# Patient Record
Sex: Female | Born: 1975 | Race: Black or African American | Hispanic: No | Marital: Single | Smoking: Current every day smoker
Health system: Southern US, Community
[De-identification: ages and names within clinical notes are randomized; demographics above are authoritative.]

## PROBLEM LIST (undated history)

## (undated) DIAGNOSIS — D649 Anemia, unspecified: Secondary | ICD-10-CM

## (undated) DIAGNOSIS — J45909 Unspecified asthma, uncomplicated: Secondary | ICD-10-CM

---

## 2014-03-01 ENCOUNTER — Emergency Department: Payer: Self-pay | Admitting: Emergency Medicine

## 2014-04-02 ENCOUNTER — Emergency Department: Payer: Self-pay | Admitting: Emergency Medicine

## 2014-05-10 ENCOUNTER — Emergency Department: Payer: Self-pay | Admitting: Internal Medicine

## 2014-05-15 ENCOUNTER — Emergency Department: Payer: Self-pay | Admitting: Emergency Medicine

## 2014-10-30 ENCOUNTER — Emergency Department
Admit: 2014-10-30 | Disposition: A | Discharge status: Home / Self Care | Payer: Self-pay | Admitting: Emergency Medicine

## 2014-10-30 LAB — CBC WITH DIFFERENTIAL/PLATELET
Basophil #: 0 10*3/uL (ref 0.0–0.1)
Basophil %: 0.6 %
EOS PCT: 0.3 %
Eosinophil #: 0 10*3/uL (ref 0.0–0.7)
HCT: 26.6 % — ABNORMAL LOW (ref 35.0–47.0)
HGB: 8 g/dL — ABNORMAL LOW (ref 12.0–16.0)
LYMPHS ABS: 2.5 10*3/uL (ref 1.0–3.6)
LYMPHS PCT: 35.1 %
MCH: 20.3 pg — ABNORMAL LOW (ref 26.0–34.0)
MCHC: 29.9 g/dL — ABNORMAL LOW (ref 32.0–36.0)
MCV: 68 fL — ABNORMAL LOW (ref 80–100)
MONOS PCT: 7.5 %
Monocyte #: 0.5 x10 3/mm (ref 0.2–0.9)
Neutrophil #: 4 10*3/uL (ref 1.4–6.5)
Neutrophil %: 56.5 %
PLATELETS: 481 10*3/uL — AB (ref 150–440)
RBC: 3.93 10*6/uL (ref 3.80–5.20)
RDW: 19.3 % — AB (ref 11.5–14.5)
WBC: 7.1 10*3/uL (ref 3.6–11.0)

## 2014-10-30 LAB — BASIC METABOLIC PANEL
ANION GAP: 9 (ref 7–16)
BUN: 7 mg/dL
CHLORIDE: 106 mmol/L
Calcium, Total: 8.8 mg/dL — ABNORMAL LOW
Co2: 24 mmol/L
Creatinine: 0.63 mg/dL
EGFR (African American): >60
GLUCOSE: 84 mg/dL
Potassium: 3.3 mmol/L — ABNORMAL LOW
SODIUM: 139 mmol/L

## 2014-12-08 ENCOUNTER — Emergency Department: Payer: Self-pay

## 2014-12-08 ENCOUNTER — Emergency Department
Admission: EM | Admit: 2014-12-08 | Discharge: 2014-12-08 | Disposition: A | Discharge status: Home / Self Care | Payer: Self-pay | Attending: Emergency Medicine | Admitting: Emergency Medicine

## 2014-12-08 DIAGNOSIS — N739 Female pelvic inflammatory disease, unspecified: Secondary | ICD-10-CM

## 2014-12-08 DIAGNOSIS — R112 Nausea with vomiting, unspecified: Secondary | ICD-10-CM | POA: Insufficient documentation

## 2014-12-08 DIAGNOSIS — N760 Acute vaginitis: Secondary | ICD-10-CM

## 2014-12-08 DIAGNOSIS — B9689 Other specified bacterial agents as the cause of diseases classified elsewhere: Secondary | ICD-10-CM

## 2014-12-08 DIAGNOSIS — Z3202 Encounter for pregnancy test, result negative: Secondary | ICD-10-CM | POA: Insufficient documentation

## 2014-12-08 DIAGNOSIS — R102 Pelvic and perineal pain: Secondary | ICD-10-CM

## 2014-12-08 DIAGNOSIS — R197 Diarrhea, unspecified: Secondary | ICD-10-CM | POA: Insufficient documentation

## 2014-12-08 DIAGNOSIS — Z720 Tobacco use: Secondary | ICD-10-CM | POA: Insufficient documentation

## 2014-12-08 HISTORY — DX: Anemia, unspecified: D64.9

## 2014-12-08 LAB — CBC WITH DIFFERENTIAL/PLATELET
BASOS ABS: 0.1 10*3/uL (ref 0–0.1)
Basophils Relative: 1 %
EOS ABS: 0.1 10*3/uL (ref 0–0.7)
HCT: 29.5 % — ABNORMAL LOW (ref 35.0–47.0)
Hemoglobin: 8.6 g/dL — ABNORMAL LOW (ref 12.0–16.0)
Lymphocytes Relative: 43 %
Lymphs Abs: 2.1 10*3/uL (ref 1.0–3.6)
MCH: 20 pg — AB (ref 26.0–34.0)
MCHC: 29.3 g/dL — AB (ref 32.0–36.0)
MCV: 68.4 fL — ABNORMAL LOW (ref 80.0–100.0)
MONO ABS: 0.3 10*3/uL (ref 0.2–0.9)
NEUTROS ABS: 2.3 10*3/uL (ref 1.4–6.5)
Neutrophils Relative %: 49 %
PLATELETS: 403 10*3/uL (ref 150–440)
RBC: 4.31 MIL/uL (ref 3.80–5.20)
RDW: 21 % — ABNORMAL HIGH (ref 11.5–14.5)
WBC: 4.8 10*3/uL (ref 3.6–11.0)

## 2014-12-08 LAB — CHLAMYDIA/NGC RT PCR (ARMC ONLY)
CHLAMYDIA TR: DETECTED
N gonorrhoeae: NOT DETECTED

## 2014-12-08 LAB — URINALYSIS COMPLETE WITH MICROSCOPIC (ARMC ONLY)
BACTERIA UA: NONE SEEN
Bilirubin Urine: NEGATIVE
GLUCOSE, UA: NEGATIVE mg/dL
HGB URINE DIPSTICK: NEGATIVE
Ketones, ur: NEGATIVE mg/dL
Leukocytes, UA: NEGATIVE
NITRITE: NEGATIVE
PROTEIN: 30 mg/dL — AB
SPECIFIC GRAVITY, URINE: 1.027 (ref 1.005–1.030)
pH: 5 (ref 5.0–8.0)

## 2014-12-08 LAB — COMPREHENSIVE METABOLIC PANEL
ALBUMIN: 4.2 g/dL (ref 3.5–5.0)
ALK PHOS: 35 U/L — AB (ref 38–126)
ALT: 14 U/L (ref 14–54)
ANION GAP: 7 (ref 5–15)
AST: 25 U/L (ref 15–41)
BUN: 15 mg/dL (ref 6–20)
CHLORIDE: 111 mmol/L (ref 101–111)
CO2: 22 mmol/L (ref 22–32)
Calcium: 9.4 mg/dL (ref 8.9–10.3)
Creatinine, Ser: 0.9 mg/dL (ref 0.44–1.00)
GFR calc non Af Amer: >60 mL/min (ref >60)
Glucose, Bld: 90 mg/dL (ref 65–99)
Potassium: 4.4 mmol/L (ref 3.5–5.1)
Sodium: 140 mmol/L (ref 135–145)
TOTAL PROTEIN: 7.5 g/dL (ref 6.5–8.1)
Total Bilirubin: 0.3 mg/dL (ref 0.3–1.2)

## 2014-12-08 LAB — WET PREP, GENITAL: Yeast Wet Prep HPF POC: NONE SEEN

## 2014-12-08 LAB — POCT PREGNANCY, URINE: Preg Test, Ur: NEGATIVE

## 2014-12-08 LAB — LIPASE, BLOOD: Lipase: 36 U/L (ref 22–51)

## 2014-12-08 MED ORDER — MORPHINE SULFATE 4 MG/ML IJ SOLN
4.0000 mg | Freq: Once | INTRAMUSCULAR | Status: AC
  Administered 2014-12-08: 4 mg via INTRAVENOUS

## 2014-12-08 MED ORDER — DOXYCYCLINE HYCLATE 100 MG PO TABS
100.0000 mg | ORAL_TABLET | Freq: Once | ORAL | Status: AC
  Administered 2014-12-08: 100 mg via ORAL

## 2014-12-08 MED ORDER — OXYCODONE-ACETAMINOPHEN 5-325 MG PO TABS
ORAL_TABLET | ORAL | Status: AC
  Administered 2014-12-08: 2 via ORAL
  Filled 2014-12-08: qty 2

## 2014-12-08 MED ORDER — MORPHINE SULFATE 4 MG/ML IJ SOLN
INTRAMUSCULAR | Status: AC
  Administered 2014-12-08: 4 mg via INTRAVENOUS
  Filled 2014-12-08: qty 1

## 2014-12-08 MED ORDER — DOXYCYCLINE HYCLATE 100 MG PO TABS: 100.0000 mg | ORAL_TABLET | Freq: Two times a day (BID) | ORAL | Status: DC

## 2014-12-08 MED ORDER — HYDROCODONE-ACETAMINOPHEN 5-325 MG PO TABS: 1.0000 | ORAL_TABLET | ORAL | Status: DC | PRN

## 2014-12-08 MED ORDER — SODIUM CHLORIDE 0.9 % IV BOLUS (SEPSIS)
1000.0000 mL | Freq: Once | INTRAVENOUS | Status: AC
  Administered 2014-12-08: 1000 mL via INTRAVENOUS

## 2014-12-08 MED ORDER — MORPHINE SULFATE 4 MG/ML IJ SOLN
4.0000 mg | Freq: Once | INTRAMUSCULAR | Status: AC
Start: 2014-12-08 — End: 2014-12-08
  Administered 2014-12-08: 4 mg via INTRAVENOUS

## 2014-12-08 MED ORDER — MORPHINE SULFATE 4 MG/ML IJ SOLN
INTRAMUSCULAR | Status: AC
  Filled 2014-12-08: qty 1

## 2014-12-08 MED ORDER — METRONIDAZOLE 500 MG PO TABS: 500.0000 mg | ORAL_TABLET | Freq: Three times a day (TID) | ORAL | Status: AC

## 2014-12-08 MED ORDER — DOXYCYCLINE HYCLATE 100 MG PO TABS
ORAL_TABLET | ORAL | Status: AC
  Administered 2014-12-08: 100 mg via ORAL
  Filled 2014-12-08: qty 1

## 2014-12-08 MED ORDER — CEFTRIAXONE SODIUM IN DEXTROSE 20 MG/ML IV SOLN
INTRAVENOUS | Status: AC
  Administered 2014-12-08: 1 g via INTRAVENOUS
  Filled 2014-12-08: qty 50

## 2014-12-08 MED ORDER — OXYCODONE-ACETAMINOPHEN 5-325 MG PO TABS
2.0000 | ORAL_TABLET | Freq: Once | ORAL | Status: AC
  Administered 2014-12-08: 2 via ORAL

## 2014-12-08 MED ORDER — CEFTRIAXONE SODIUM IN DEXTROSE 20 MG/ML IV SOLN
1.0000 g | Freq: Once | INTRAVENOUS | Status: AC
  Administered 2014-12-08: 1 g via INTRAVENOUS

## 2014-12-08 NOTE — ED Notes (Signed)
Pt to us via stretcher

## 2014-12-08 NOTE — ED Notes (Addendum)
Pt arrives c/o nausea, vomiting diarrhea ,lower abdominal pain that began yesterday at 3AM. Lower abdominal pain. Pt received 4mg  zofran IV with EMS.  Denies blood with emesis or defecation. Pt alert and oriented X4, active, cooperative, pt in NAD. RR even and unlabored, color WNL.

## 2014-12-08 NOTE — ED Notes (Signed)
Advised pt need for urine.  Pt states she does not have to void at this time. Pt on monitor, awaiting md to see.

## 2014-12-08 NOTE — ED Provider Notes (Signed)
[facility] Emergency Department Provider Note  Time seen: 8:34 AM  I have reviewed the triage vital signs and the nursing notes.   HISTORY  Chief Complaint Nausea; Emesis; Diarrhea; and Abdominal Pain    HPI Lori Pierce is a 39 y.o. female with a past medical history of anemia presents the emergency department with 2 days of lower abdominal pain, nausea, vomiting, diarrhea. According to the patient for the past 2 days she has had a constant, gradually worsening dull/aching lower abdominal pain. She now describes the pain as severe. She states she has had this pain before with a viral gastroenteritis per the patient. She was given Zofran by EMS and states her nausea has resolved, but her abdominal pain continues. She denies any bloody/black stool or vomit. Denies any dysuria, but does note a foul smell to her urine. Denies any vaginal bleeding or discharge. Patient's last period was one month ago, she remains sexually active.    Past Medical History  Diagnosis Date  . Anemia     There are no active problems to display for this patient.   History reviewed. No pertinent past surgical history.  No current outpatient prescriptions on file.  Allergies Review of patient's allergies indicates no known allergies.  No family history on file.  Social History History  Substance Use Topics  . Smoking status: Current Every Day Smoker  . Smokeless tobacco: Not on file  . Alcohol Use: Yes    Review of Systems Constitutional: Negative for fever. Cardiovascular: Negative for chest pain. Respiratory: Negative for shortness of breath. Gastrointestinal: Positive for lower abdominal pain, nausea, vomiting, and diarrhea. Genitourinary: Negative for dysuria. Positive for foul smell to her urine. Musculoskeletal: Negative for back pain. Skin: Negative for rash. Neurological: Negative for headache 10-point ROS otherwise  negative.  ____________________________________________   PHYSICAL EXAM:  VITAL SIGNS: ED Triage Vitals  Enc Vitals Group     BP 12/08/14 0800 99/72 mmHg     Pulse Rate 12/08/14 0800 74     Resp 12/08/14 0800 18     Temp 12/08/14 0800 98.2 F (36.8 C)     Temp Source 12/08/14 0800 Oral     SpO2 12/08/14 0800 100 %     Weight 12/08/14 0800 135 lb (61.236 kg)     Height 12/08/14 0800  (1.626 m)     Head Cir --      Peak Flow --      Pain Score 12/08/14 0800 10     Pain Loc --      Pain Edu? --      Excl. in GC? --     Constitutional: Alert and oriented. Well appearing and in no distress. ENT   Head: Normocephalic and atraumatic.   Mouth/Throat: Mucous membranes are moist. Cardiovascular: Normal rate, regular rhythm. No murmur Respiratory: Normal respiratory effort without tachypnea nor retractions. Breath sounds are clear Gastrointestinal: Moderate lower abdominal tenderness to palpation. Appears mostly suprapubic/midline. No rebound or guarding. No CVA tenderness palpation. Musculoskeletal: Nontender with normal range of motion in all extremities.  Neurologic:  Normal speech and language. No gross focal neurologic deficits Skin:  Skin is warm, dry and intact.  Psychiatric: Mood and affect are normal. Speech and behavior are normal.   ____________________________________________    RADIOLOGY  2.4 cm left ovarian cyst/complicated cyst/hemorrhagic cyst  ____________________________________________   INITIAL IMPRESSION / ASSESSMENT AND PLAN / ED COURSE  Pertinent labs & imaging results that were available during my care  of the patient were reviewed by me and considered in my medical decision making (see chart for details).  Patient with 2 days of lower abdominal pain, nausea, vomiting, diarrhea. We will check labs, treat pain, nausea, IV hydrate and closely monitor in the emergency department.   Pelvic exam shows moderate discharge, with mild cervical  motion tenderness and moderate right adnexal tenderness. Wet prep positive for clue cells. We will treat the patient as pelvic inflammatory disease, patient received IV ceftriaxone and we will place on oral doxycycline. We will also place the patient on Flagyl for bacterial vaginitis. I discussed these results with the patient, as well as having her partner treated/tested. Patient agreeable to plan. We will discharge the patient on Norco for pain management, and antibiotics. Patient to follow-up with OB/GYN for a repeat ultrasound in 2-4 weeks to ensure resolution of complicated left ovarian cyst. Patient agreeable to this plan. ____________________________________________   FINAL CLINICAL IMPRESSION(S) / ED DIAGNOSES  Lower abdominal pain Pelvic inflammatory disease Bacterial vaginitis   Minna Antis, MD 12/08/14 1318

## 2014-12-08 NOTE — ED Notes (Signed)
Pt given warm blanket.  Resting on stretcher at this time.

## 2014-12-08 NOTE — Discharge Instructions (Signed)
Pelvic Inflammatory Disease °Pelvic inflammatory disease (PID) refers to an infection in some or all of the female organs. The infection can be in the uterus, ovaries, fallopian tubes, or the surrounding tissues in the pelvis. PID can cause abdominal or pelvic pain that comes on suddenly (acute pelvic pain). PID is a serious infection because it can lead to lasting (chronic) pelvic pain or the inability to have children (infertile).  °CAUSES  °The infection is often caused by the normal bacteria found in the vaginal tissues. PID may also be caused by an infection that is spread during sexual contact. PID can also occur following:  °· The birth of a baby.   °· A miscarriage.   °· An abortion.   °· Major pelvic surgery.   °· The use of an intrauterine device (IUD).   °· A sexual assault.   °RISK FACTORS °Certain factors can put a person at higher risk for PID, such as: °· Being younger than 25 years. °· Being sexually active at a young age. °· Using nonbarrier contraception. °· Having multiple sexual partners. °· Having sex with someone who has symptoms of a genital infection. °· Using oral contraception. °Other times, certain behaviors can increase the possibility of getting PID, such as: °· Having sex during your period. °· Using a vaginal douche. °· Having an intrauterine device (IUD) in place. °SYMPTOMS  °· Abdominal or pelvic pain.   °· Fever.   °· Chills.   °· Abnormal vaginal discharge. °· Abnormal uterine bleeding.   °· Unusual pain shortly after finishing your period. °DIAGNOSIS  °Your caregiver will choose some of the following methods to make a diagnosis, such as:  °· Performing a physical exam and history. A pelvic exam typically reveals a very tender uterus and surrounding pelvis.   °· Ordering laboratory tests including a pregnancy test, blood tests, and urine test.  °· Ordering cultures of the vagina and cervix to check for a sexually transmitted infection (STI). °· Performing an ultrasound.    °· Performing a laparoscopic procedure to look inside the pelvis.   °TREATMENT  °· Antibiotic medicines may be prescribed and taken by mouth.   °· Sexual partners may be treated when the infection is caused by a sexually transmitted disease (STD).   °· Hospitalization may be needed to give antibiotics intravenously. °· Surgery may be needed, but this is rare. °It may take weeks until you are completely well. If you are diagnosed with PID, you should also be checked for human immunodeficiency virus (HIV).   °HOME CARE INSTRUCTIONS  °· If given, take your antibiotics as directed. Finish the medicine even if you start to feel better.   °· Only take over-the-counter or prescription medicines for pain, discomfort, or fever as directed by your caregiver.   °· Do not have sexual intercourse until treatment is completed or as directed by your caregiver. If PID is confirmed, your recent sexual partner(s) will need treatment.   °· Keep your follow-up appointments. °SEEK MEDICAL CARE IF:  °· You have increased or abnormal vaginal discharge.   °· You need prescription medicine for your pain.   °· You vomit.   °· You cannot take your medicines.   °· Your partner has an STD.   °SEEK IMMEDIATE MEDICAL CARE IF:  °· You have a fever.   °· You have increased abdominal or pelvic pain.   °· You have chills.   °· You have pain when you urinate.   °· You are not better after 72 hours following treatment.   °MAKE SURE YOU:  °· Understand these instructions. °· Will watch your condition. °· Will get help right away if you are not doing well or get worse. °  Document Released: 06/19/2005 Document Revised: 10/14/2012 Document Reviewed: 06/15/2011 [facility] Patient Information 2015 [place], [place]. This information is not intended to replace advice given to you by your health care provider. Make sure you discuss any questions you have with your health care provider.     As we have discussed please take all of your medications as  prescribed. Please take the full course of antibiotics even if feeling better. As we have discussed her ultrasound shows a 2.4 cm left ovarian cyst, please follow-up with OB/GYN within 2-4 weeks for follow-up, to ensure resolution. Return to the emergency department for any fever, worsening symptoms, worsening abdominal pain, or any other personally concerning symptoms.

## 2015-01-22 ENCOUNTER — Emergency Department
Admission: EM | Admit: 2015-01-22 | Discharge: 2015-01-22 | Disposition: A | Discharge status: Home / Self Care | Payer: Self-pay | Attending: Emergency Medicine | Admitting: Emergency Medicine

## 2015-01-22 DIAGNOSIS — Z720 Tobacco use: Secondary | ICD-10-CM | POA: Insufficient documentation

## 2015-01-22 DIAGNOSIS — H1031 Unspecified acute conjunctivitis, right eye: Secondary | ICD-10-CM | POA: Insufficient documentation

## 2015-01-22 DIAGNOSIS — Z792 Long term (current) use of antibiotics: Secondary | ICD-10-CM | POA: Insufficient documentation

## 2015-01-22 MED ORDER — EYE WASH OPHTH SOLN
OPHTHALMIC | Status: AC
  Filled 2015-01-22: qty 118

## 2015-01-22 MED ORDER — TOBRAMYCIN 0.3 % OP SOLN: 2.0000 [drp] | OPHTHALMIC | Status: DC

## 2015-01-22 MED ORDER — FLUORESCEIN SODIUM 1 MG OP STRP
1.0000 | ORAL_STRIP | Freq: Once | OPHTHALMIC | Status: DC
  Filled 2015-01-22: qty 1

## 2015-01-22 MED ORDER — TETRACAINE HCL 0.5 % OP SOLN
1.0000 [drp] | Freq: Once | OPHTHALMIC | Status: AC
  Administered 2015-01-22: 1 [drp] via OPHTHALMIC
  Filled 2015-01-22: qty 2

## 2015-01-22 MED ORDER — EYE WASH OPHTH SOLN
1.0000 [drp] | OPHTHALMIC | Status: DC | PRN
  Administered 2015-01-22: 1 [drp] via OPHTHALMIC
  Filled 2015-01-22: qty 118

## 2015-01-22 MED ORDER — TETRACAINE HCL 0.5 % OP SOLN
OPHTHALMIC | Status: AC
  Filled 2015-01-22: qty 2

## 2015-01-22 NOTE — ED Notes (Signed)
Pt c/o right eye irritation with drainage since yesterday

## 2015-01-22 NOTE — ED Provider Notes (Signed)
[facility] Emergency Department Provider Note  ____________________________________________  Time seen: Approximately 7:29 AM  I have reviewed the triage vital signs and the nursing notes.   HISTORY  Chief Complaint Eye Drainage   HPI Lori Pierce is a 39 y.o. female is here with complaint of right eye irritation and drainage since yesterday. She states she has been exposed to pinkeye. Drainage has been yellow/green at times. She states her eyelashes were matted shut first thing this morning. She denies any changes in her vision. She continues to wear contacts in her affected eye. She denies any foreign body or injury to her eye. Currently her pain is 6 out of 10.   Past Medical History  Diagnosis Date  . Anemia     There are no active problems to display for this patient.   History reviewed. No pertinent past surgical history.  Current Outpatient Rx  Name  Route  Sig  Dispense  Refill  . doxycycline (VIBRA-TABS) 100 MG tablet   Oral   Take 1 tablet (100 mg total) by mouth 2 (two) times daily.   20 tablet   0   . HYDROcodone-acetaminophen (NORCO) 5-325 MG per tablet   Oral   Take 1 tablet by mouth every 4 (four) hours as needed for moderate pain.   12 tablet   0   . tobramycin (TOBREX) 0.3 % ophthalmic solution   Left Eye   Place 2 drops into the left eye every 4 (four) hours.   5 mL   0     Allergies Review of patient's allergies indicates no known allergies.  No family history on file.  Social History History  Substance Use Topics  . Smoking status: Current Every Day Smoker  . Smokeless tobacco: Not on file  . Alcohol Use: Yes    Review of Systems Constitutional: No fever/chills Eyes: No visual changes. ENT: No sore throat. Cardiovascular: Denies chest pain. Respiratory: Denies shortness of breath. Gastrointestinal: No abdominal pain.  No nausea, no vomiting. Genitourinary: Negative for dysuria. Musculoskeletal:  Negative for back pain. Skin: Negative for rash. Neurological: Negative for headaches  10-point ROS otherwise negative.  ____________________________________________   PHYSICAL EXAM:  VITAL SIGNS: ED Triage Vitals  Enc Vitals Group     BP 01/22/15 0717 141/91 mmHg     Pulse Rate 01/22/15 0717 92     Resp 01/22/15 0717 16     Temp 01/22/15 0717 98.3 F (36.8 C)     Temp Source 01/22/15 0717 Oral     SpO2 01/22/15 0717 100 %     Weight 01/22/15 0717 130 lb (58.968 kg)     Height 01/22/15 0717 5\' 4"  (1.626 m)     Head Cir --      Peak Flow --      Pain Score 01/22/15 0718 6     Pain Loc --      Pain Edu? --      Excl. in GC? --     Constitutional: Alert and oriented. Well appearing and in no acute distress. Eyes: Conjunctivae are normal. PERRL. EOMI.   right sclera with mild injection and minimal discharge. Head: Atraumatic. Nose: No congestion/rhinnorhea.  Mouth/Throat: Mucous membranes are moist.  Oropharynx non-erythematous. Neck: No stridor.  Supple Cardiovascular: Normal rate, regular rhythm. Grossly normal heart sounds.  Good peripheral circulation. Respiratory: Normal respiratory effort.  No retractions. Lungs CTAB. Gastrointestinal: Soft and nontender. No distention. Musculoskeletal: No lower extremity tenderness nor edema.  No joint  effusions. Neurologic:  Normal speech and language. No gross focal neurologic deficits are appreciated. No gait instability. Skin:  Skin is warm, dry and intact. No rash noted. Psychiatric: Mood and affect are normal. Speech and behavior are normal.  ____________________________________________   LABS (all labs ordered are listed, but only abnormal results are displayed)  Labs Reviewed - No data to display  PROCEDURES  Procedure(s) performed: 2 drops of tetracaine was placed in the right eye. Right upper lid was inverted without foreign body seen. 4 seen stain was placed without any corneal abrasions seen. I was flushed with  ophthalmic eyewash.  Critical Care performed: No  ____________________________________________   INITIAL IMPRESSION / ASSESSMENT AND PLAN / ED COURSE  Pertinent labs & imaging results that were available during my care of the patient were reviewed by me and considered in my medical decision making (see chart for details).  Patient was prescribed tobramycin ophthalmic solution 2 drops to eye every 4 hours. She is also to remove her contacts and began using her glasses. She will not use her contacts until completely sterilized or if instructed by her doctor to throw this pair of contacts away.  If not improved she is to follow-up with [facility]. ____________________________________________   FINAL CLINICAL IMPRESSION(S) / ED DIAGNOSES  Final diagnoses:  Acute conjunctivitis of right eye      Tommi Rumps, PA-C 01/22/15 1028  Phineas Semen, MD 01/22/15 1054

## 2015-01-22 NOTE — Discharge Instructions (Signed)

## 2015-02-03 ENCOUNTER — Emergency Department
Admission: EM | Admit: 2015-02-03 | Discharge: 2015-02-03 | Disposition: A | Discharge status: Home / Self Care | Payer: Self-pay | Attending: Emergency Medicine | Admitting: Emergency Medicine

## 2015-02-03 ENCOUNTER — Encounter: Payer: Self-pay | Admitting: Medical Oncology

## 2015-02-03 DIAGNOSIS — H109 Unspecified conjunctivitis: Secondary | ICD-10-CM | POA: Insufficient documentation

## 2015-02-03 DIAGNOSIS — Z720 Tobacco use: Secondary | ICD-10-CM | POA: Insufficient documentation

## 2015-02-03 MED ORDER — EYE WASH OPHTH SOLN
1.0000 [drp] | OPHTHALMIC | Status: DC | PRN
  Administered 2015-02-03: 1 [drp] via OPHTHALMIC
  Filled 2015-02-03: qty 118

## 2015-02-03 MED ORDER — TOBRAMYCIN 0.3 % OP SOLN: 2.0000 [drp] | OPHTHALMIC | Status: DC

## 2015-02-03 MED ORDER — TETRACAINE HCL 0.5 % OP SOLN
1.0000 [drp] | Freq: Once | OPHTHALMIC | Status: AC
  Administered 2015-02-03: 1 [drp] via OPHTHALMIC
  Filled 2015-02-03: qty 2

## 2015-02-03 MED ORDER — FLUORESCEIN SODIUM 1 MG OP STRP
1.0000 | ORAL_STRIP | Freq: Once | OPHTHALMIC | Status: AC
  Administered 2015-02-03: 1 via OPHTHALMIC
  Filled 2015-02-03: qty 1

## 2015-02-03 NOTE — ED Notes (Signed)
Rt eye irritation, redness and draining since yesterday.

## 2015-02-03 NOTE — Discharge Instructions (Signed)
Conjunctivitis Conjunctivitis is commonly called "pink eye." Conjunctivitis can be caused by bacterial or viral infection, allergies, or injuries. There is usually redness of the lining of the eye, itching, discomfort, and sometimes discharge. There may be deposits of matter along the eyelids. A viral infection usually causes a watery discharge, while a bacterial infection causes a yellowish, thick discharge. Pink eye is very contagious and spreads by direct contact. You may be given antibiotic eyedrops as part of your treatment. Before using your eye medicine, remove all drainage from the eye by washing gently with warm water and cotton balls. Continue to use the medication until you have awakened 2 mornings in a row without discharge from the eye. Do not rub your eye. This increases the irritation and helps spread infection. Use separate towels from other household members. Wash your hands with soap and water before and after touching your eyes. Use cold compresses to reduce pain and sunglasses to relieve irritation from light. Do not wear contact lenses or wear eye makeup until the infection is gone. SEEK MEDICAL CARE IF:   Your symptoms are not better after 3 days of treatment.  You have increased pain or trouble seeing.  The outer eyelids become very red or swollen. Document Released: 07/27/2004 Document Revised: 09/11/2011 Document Reviewed: 06/19/2005 [facility] [place] [facility] Patient Information 2015 [place], [place]. This information is not intended to replace advice given to you by your health care provider. Make sure you discuss any questions you have with your health care provider.   YOU WILL NEED TO FOLLOW UP WITH DR. PORFILIO.  CALL FOR AN APPOINTMENT.  DO NOT WEAR YOUR CONTACTS UNTIL YOUR EYE IS COMPLETELY CLEAR USE EYE DROPS AS DIRECTED EVERY 4 HOURS WHILE AWAKE DO WASH YOUR HANDS IMMEDIATELY AFTER TOUCHING YOUR EYE YOU CAN NOT WEAR THE CONTACT THAT YOU WERE WEARING WHEN YOUR EYE GOT INFECTED

## 2015-02-03 NOTE — ED Provider Notes (Signed)
[facility] Emergency Department Provider Note  ____________________________________________  Time seen:  7:13 AM  I have reviewed the triage vital signs and the nursing notes.   HISTORY  Chief Complaint Conjunctivitis   HPI Lori Pierce is a 39 y.o. female is here with right eye irritation and redness. She states it was draining yesterday. This morning her eyelashes were matted shut and draining thick yellowish stuff. She denies any difficulty with her vision. 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 She was seen on 7/22 at which time she was having redness and drainage from the same eye. At that time she gave a history of being exposed to pink eye. She continues to wear disposable contacts that she changes once a week. She denies any foreign body sensation or injury to her eye. Currently she describes her pain as a 10 out of 10. She has not taken any over-the-counter medication for this.   Past Medical History  Diagnosis Date  . Anemia     There are no active problems to display for this patient.   History reviewed. No pertinent past surgical history.  Current Outpatient Rx  Name  Route  Sig  Dispense  Refill  . doxycycline (VIBRA-TABS) 100 MG tablet   Oral   Take 1 tablet (100 mg total) by mouth 2 (two) times daily.   20 tablet   0   . HYDROcodone-acetaminophen (NORCO) 5-325 MG per tablet   Oral   Take 1 tablet by mouth every 4 (four) hours as needed for moderate pain.   12 tablet   0   . tobramycin (TOBREX) 0.3 % ophthalmic solution   Right Eye   Place 2 drops into the right eye every 4 (four) hours.   5 mL   0     Allergies Review of patient's allergies indicates no known allergies.  No family history on file.  Social History History  Substance Use Topics  . Smoking status: Current Every Day Smoker  . Smokeless tobacco: Not on file   . Alcohol Use: Yes    Review of Systems Constitutional: No fever/chills Eyes: No visual changes, drainage and matting positive. ENT: No sore throat. Cardiovascular: Denies chest pain. Respiratory: Denies shortness of breath. Gastrointestinal: No abdominal pain.  No nausea, no vomiting. Genitourinary: Negative for dysuria. Musculoskeletal: Negative for back pain. Skin: Negative for rash. Neurological: Negative for headaches, focal weakness or numbness.  10-point ROS otherwise negative.  ____________________________________________   PHYSICAL EXAM:  VITAL SIGNS: ED Triage Vitals  Enc Vitals Group     BP 02/03/15 0706 121/90 mmHg     Pulse Rate 02/03/15 0706 91     Resp 02/03/15 0706 18     Temp 02/03/15 0706 98.7 F (37.1 C)     Temp Source 02/03/15 0706 Oral     SpO2 02/03/15 0706 99 %     Weight 02/03/15 0706 130 lb (58.968 kg)     Height 02/03/15 0706  (1.626 m)     Head Cir --      Peak Flow --      Pain Score 02/03/15 0706 10     Pain Loc --      Pain Edu? --      Excl. in GC? --     Constitutional: Alert and oriented. Well appearing and in no acute distress. Eyes: Right eye with moderate injection. There is evidence of dried  exudate along the lower eyelid and lashes. PERRL. EOMI. no foreign body was noted. Head: Atraumatic. Nose: No congestion/rhinnorhea. Neck: No stridor. Cardiovascular: Normal rate, regular rhythm. Grossly normal heart sounds.  Good peripheral circulation. Respiratory: Normal respiratory effort.  No retractions. Lungs CTAB. Gastrointestinal: Soft and nontender. No distention. Musculoskeletal: No lower extremity tenderness nor edema.  No joint effusions. Neurologic:  Normal speech and language. No gross focal neurologic deficits are appreciated. No gait instability. Skin:  Skin is warm, dry and intact. No rash noted. Psychiatric: Mood and affect are normal. Speech and behavior are  normal.  ____________________________________________   LABS (all labs ordered are listed, but only abnormal results are displayed)  Labs Reviewed - No data to display  PROCEDURES  Procedure(s) performed: Stain was placed in the right eye without any corneal abrasions noted. Eye was flushed with eyewash. Upper lid was inverted without foreign body seen. Lower lid no foreign body noted. No corneal ulcer was noted. There is no fluroscene uptake.  Critical Care performed: No  ____________________________________________   INITIAL IMPRESSION / ASSESSMENT AND PLAN / ED COURSE  Pertinent labs & imaging results that were available during my care of the patient were reviewed by me and considered in my medical decision making (see chart for details).  Patient was told to follow-up with [facility] since this is the second time in less than 30 days that she has been here for the same thing. She became quite upset when there was not a specific date on her note saying when she would be going back to work. I explained that she is out of work until her eye completely clears. At that time she became extremely irritated. She states that her company will not accept this note. She also offered to get the note from her last visit out of her car to show me.  Apparently this was not given to her supervisor. I explained that her vision and eye care is very concerning especially since she wears contacts. Patient left disgruntled ____________________________________________   FINAL CLINICAL IMPRESSION(S) / ED DIAGNOSES  Final diagnoses:  Conjunctivitis of right eye      Tommi Rumps, PA-C 02/03/15 1436  Arnaldo Natal, MD 02/03/15 [phone]

## 2015-02-03 NOTE — ED Notes (Signed)
NAD noted at time of D/C. Pt ambulatory to the lobby at this time.

## 2015-02-03 NOTE — ED Notes (Signed)
Pt requesting work note with a date. Per PA, unable to give pt an exact date d/t not being able to predict when conjunctivitis will be completely clear. Pt upset at this time d/t PA not giving an exact return to work date. Pt took work note and states her supervisor will be calling her.

## 2016-01-26 ENCOUNTER — Emergency Department: Payer: Self-pay

## 2016-01-26 ENCOUNTER — Encounter: Payer: Self-pay | Admitting: Emergency Medicine

## 2016-01-26 DIAGNOSIS — F17200 Nicotine dependence, unspecified, uncomplicated: Secondary | ICD-10-CM | POA: Insufficient documentation

## 2016-01-26 DIAGNOSIS — J45909 Unspecified asthma, uncomplicated: Secondary | ICD-10-CM | POA: Insufficient documentation

## 2016-01-26 DIAGNOSIS — R0789 Other chest pain: Secondary | ICD-10-CM | POA: Insufficient documentation

## 2016-01-26 DIAGNOSIS — R0600 Dyspnea, unspecified: Secondary | ICD-10-CM | POA: Insufficient documentation

## 2016-01-26 NOTE — ED Triage Notes (Signed)
Pt ambulatory to triage with steady gait, with c/o shortness of breath due to LEFT sided rib pain since yesterday. States pain began yesterday in her back and now has radiating to left side, pt reports pain is under left breast. Pt reports when taking in a deep breath, pain shoots through left side. Pt denies known injury to area. Pt alert and oriented x 4.

## 2016-01-27 ENCOUNTER — Emergency Department
Admission: EM | Admit: 2016-01-27 | Discharge: 2016-01-27 | Discharge status: Against Medical Advice | Payer: Self-pay | Attending: Emergency Medicine | Admitting: Emergency Medicine

## 2016-01-27 DIAGNOSIS — R079 Chest pain, unspecified: Secondary | ICD-10-CM

## 2016-01-27 DIAGNOSIS — R0600 Dyspnea, unspecified: Secondary | ICD-10-CM

## 2016-01-27 HISTORY — DX: Unspecified asthma, uncomplicated: J45.909

## 2016-01-27 LAB — BASIC METABOLIC PANEL
Anion gap: 8 (ref 5–15)
BUN: 10 mg/dL (ref 6–20)
CHLORIDE: 105 mmol/L (ref 101–111)
CO2: 25 mmol/L (ref 22–32)
CREATININE: 0.61 mg/dL (ref 0.44–1.00)
Calcium: 8.7 mg/dL — ABNORMAL LOW (ref 8.9–10.3)
GFR calc non Af Amer: >60 mL/min (ref >60)
Glucose, Bld: 79 mg/dL (ref 65–99)
POTASSIUM: 3.1 mmol/L — AB (ref 3.5–5.1)
SODIUM: 138 mmol/L (ref 135–145)

## 2016-01-27 LAB — CBC
HEMATOCRIT: 34.2 % — AB (ref 35.0–47.0)
HEMOGLOBIN: 11.1 g/dL — AB (ref 12.0–16.0)
MCH: 25.5 pg — ABNORMAL LOW (ref 26.0–34.0)
MCHC: 32.6 g/dL (ref 32.0–36.0)
MCV: 78.4 fL — ABNORMAL LOW (ref 80.0–100.0)
PLATELETS: 377 10*3/uL (ref 150–440)
RBC: 4.36 MIL/uL (ref 3.80–5.20)
RDW: 21.3 % — ABNORMAL HIGH (ref 11.5–14.5)
WBC: 7.7 10*3/uL (ref 3.6–11.0)

## 2016-01-27 LAB — TROPONIN I: Troponin I: <0.03 ng/mL (ref <0.03)

## 2016-01-27 LAB — FIBRIN DERIVATIVES D-DIMER (ARMC ONLY): Fibrin derivatives D-dimer (ARMC): 403 (ref 0–499)

## 2016-01-27 MED ORDER — POTASSIUM CHLORIDE CRYS ER 20 MEQ PO TBCR
40.0000 meq | EXTENDED_RELEASE_TABLET | Freq: Once | ORAL | Status: AC
  Administered 2016-01-27: 40 meq via ORAL
  Filled 2016-01-27: qty 2

## 2016-01-27 NOTE — ED Notes (Signed)
Pt refusing to stay.  Pt states "my ride is here and I can't get left, so I'm leaving".  Pt signed AMA form.  Pt in NAD upon leaving hospital.  Pt ambulatory to lobby.

## 2016-01-29 NOTE — ED Provider Notes (Signed)
[facility] Emergency Department Provider Note  ____________________________________________  Time seen: 1:30 AM  I have reviewed the triage vital signs and the nursing notes.   HISTORY  Chief Complaint Shortness of Breath     HPI Lori Pierce is a 40 y.o. female presents with dyspnea and right-sided left chest discomfort times one day. Patient states that the pain initially started in her back and has since radiated to the anterior left side of the chest. Patient states the pain is worse with deep inspiration. Patient denies any cough no fever. Patient does admits to tobacco use and a history of asthma.    Past Medical History:  Diagnosis Date  . Anemia   . Asthma     There are no active problems to display for this patient.   Past surgical history None  Prior to Admission medications   Medication Sig Start Date End Date Taking? Authorizing Provider  doxycycline (VIBRA-TABS) 100 MG tablet Take 1 tablet (100 mg total) by mouth 2 (two) times daily. 12/08/14   Minna Antis, MD  HYDROcodone-acetaminophen (NORCO) 5-325 MG per tablet Take 1 tablet by mouth every 4 (four) hours as needed for moderate pain. 12/08/14   Minna Antis, MD  tobramycin (TOBREX) 0.3 % ophthalmic solution Place 2 drops into the right eye every 4 (four) hours. 02/03/15   Tommi Rumps, PA-C    Allergies No known drug allergies No family history on file.  Social History Social History  Substance Use Topics  . Smoking status: Current Every Day Smoker  . Smokeless tobacco: Not on file  . Alcohol use Yes    Review of Systems  Constitutional: Negative for fever. Eyes: Negative for visual changes. ENT: Negative for sore throat. Cardiovascular: Positive for chest pain. Respiratory: Positive for shortness of breath. Gastrointestinal: Negative for abdominal pain, vomiting and diarrhea. Genitourinary: Negative for dysuria. Musculoskeletal: Negative for back  pain. Skin: Negative for rash. Neurological: Negative for headaches, focal weakness or numbness.   10-point ROS otherwise negative.  ____________________________________________   PHYSICAL EXAM:  VITAL SIGNS: ED Triage Vitals  Enc Vitals Group     BP 01/26/16 2323 131/83     Pulse Rate 01/26/16 2323 76     Resp 01/26/16 2323 18     Temp 01/26/16 2323 98.3 F (36.8 C)     Temp src --      SpO2 01/26/16 2323 99 %     Weight 01/26/16 2323 120 lb (54.4 kg)     Height 01/26/16 2323  (1.626 m)     Head Circumference --      Peak Flow --      Pain Score 01/26/16 2324 10     Pain Loc --      Pain Edu? --      Excl. in GC? --     Constitutional: Alert and oriented. Well appearing and in no distress. Eyes: Conjunctivae are normal. PERRL. Normal extraocular movements. ENT   Head: Normocephalic and atraumatic.   Nose: No congestion/rhinnorhea.   Mouth/Throat: Mucous membranes are moist.   Neck: No stridor. Hematological/Lymphatic/Immunilogical: No cervical lymphadenopathy. Cardiovascular: Normal rate, regular rhythm. Normal and symmetric distal pulses are present in all extremities. No murmurs, rubs, or gallops. Respiratory: Normal respiratory effort without tachypnea nor retractions. Breath sounds are clear and equal bilaterally. No wheezes/rales/rhonchi. Gastrointestinal: Soft and nontender. No distention. There is no CVA tenderness. Genitourinary: deferred Musculoskeletal: Nontender with normal range of motion in all extremities. No joint effusions.  No lower extremity tenderness nor edema. Neurologic:  Normal speech and language. No gross focal neurologic deficits are appreciated. Speech is normal.  Skin:  Skin is warm, dry and intact. No rash noted. Psychiatric: Mood and affect are normal. Speech and behavior are normal. Patient exhibits appropriate insight and judgment.  ____________________________________________    LABS (pertinent  positives/negatives)  Labs Reviewed  BASIC METABOLIC PANEL - Abnormal; Notable for the following:       Result Value   Potassium 3.1 (*)    Calcium 8.7 (*)    All other components within normal limits  CBC - Abnormal; Notable for the following:    Hemoglobin 11.1 (*)    HCT 34.2 (*)    MCV 78.4 (*)    MCH 25.5 (*)    RDW 21.3 (*)    All other components within normal limits  TROPONIN I  FIBRIN DERIVATIVES D-DIMER ([facility] ONLY)     ____________________________________________   EKG  ED ECG REPORT I, Manson N Raynaldo Falco, the attending physician, personally viewed and interpreted this ECG.   Date: 01/29/2016  EKG Time: 11:28 PM  Rate: 71  Rhythm: Normal sinus rhythm Axis: Normal  Intervals: Normal  ST&T Change: None   ____________________________________________    RADIOLOGY  CLINICAL DATA:  40 year old female with shortness of breath EXAM: CHEST  2 VIEW COMPARISON:  None. FINDINGS: The heart size and mediastinal contours are within normal limits. Both lungs are clear. The visualized skeletal structures are unremarkable. IMPRESSION: No active cardiopulmonary disease. Electronically Signed   By: Elgie Collard M.D.   On: 01/26/2016 23:48  ____________________________________________   Procedures      INITIAL IMPRESSION / ASSESSMENT AND PLAN / ED COURSE  Pertinent labs & imaging results that were available during my care of the patient were reviewed by me and considered in my medical decision making (see chart for details).  After evaluating the patient and informed her with the plan would be to discover the etiology for chest pain including d-dimer and repeat troponin patient informed the nursing staff that she had to leave and eloped from the emergency department before [facility] paperwork could be documented ____________________________________________   FINAL CLINICAL IMPRESSION(S) / ED DIAGNOSES  Chest pain Shortness of breath    Darci Current, MD 01/29/16 [phone]

## 2016-12-10 IMAGING — US US PELVIS COMPLETE
1 series · 14 of 25 positions shown · non-contrast
Comparison: None

CLINICAL DATA: Nausea, vomiting, abdominal pain for 2 days,
diarrhea ; uncertain LMP

EXAM:
TRANSABDOMINAL AND TRANSVAGINAL ULTRASOUND OF PELVIS
TECHNIQUE: Both transabdominal and transvaginal ultrasound examinations of the
pelvis were performed. Transabdominal technique was performed for
global imaging of the pelvis including uterus, ovaries, adnexal
regions, and pelvic cul-de-sac. It was necessary to proceed with
endovaginal exam following the transabdominal exam to visualize the
ovaries and endometrium.

[Series 1: us pelvis complete · 0.21mm/px · 14 of 99 slices shown]
[im 1/99]
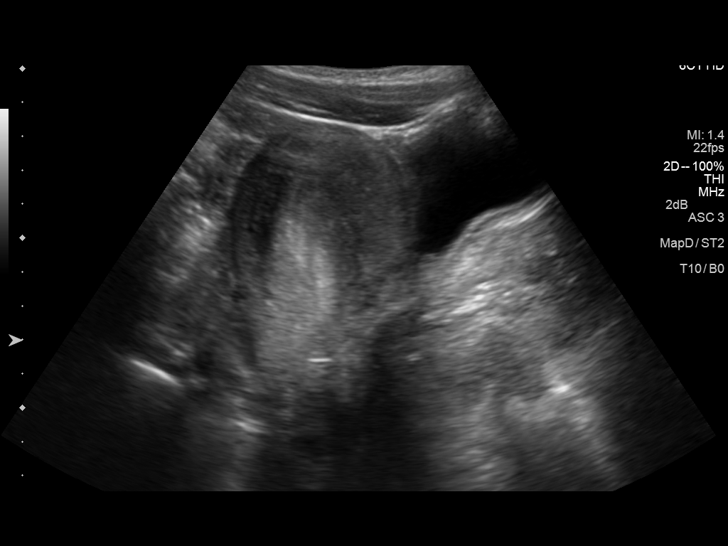
[im 9/99]
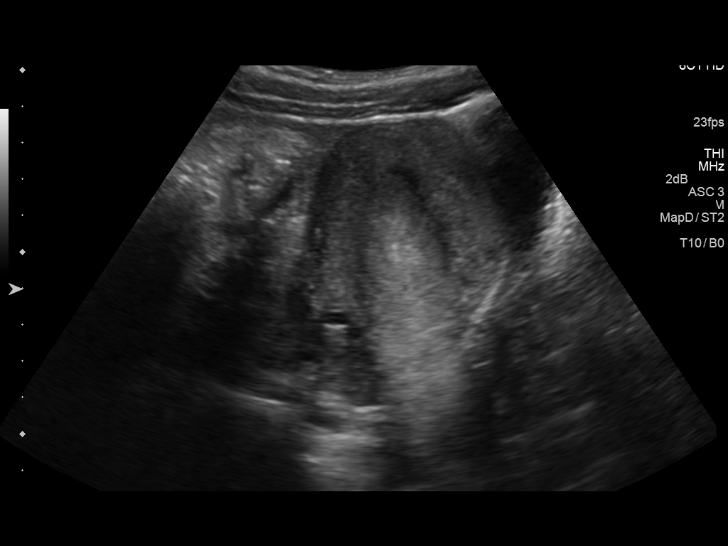
[im 17/99]
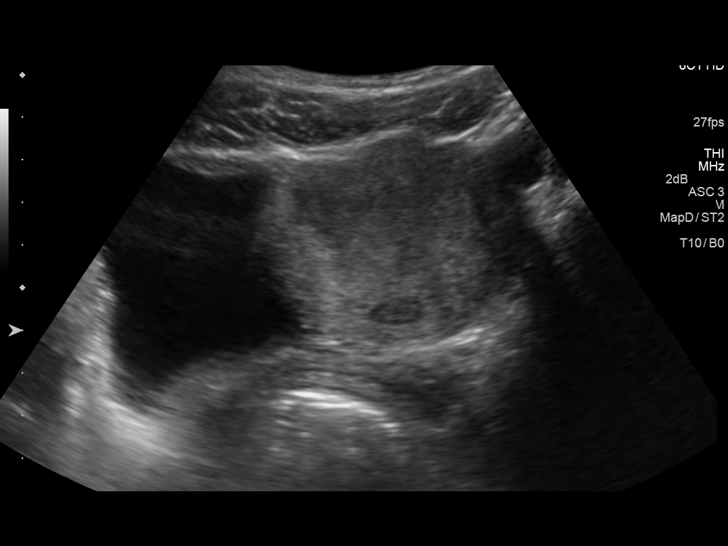
[im 25/99]
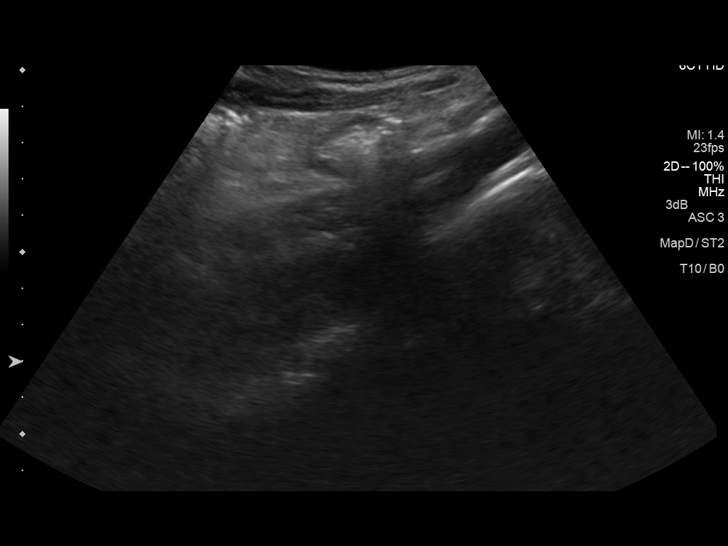
[im 33/99]
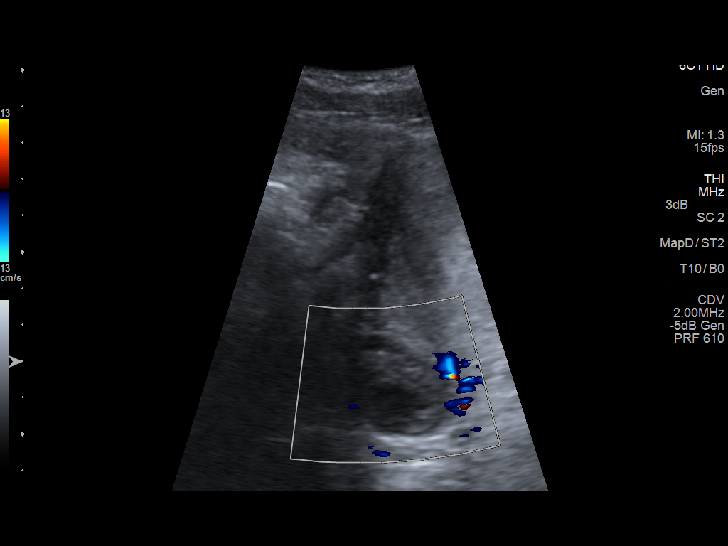
[im 37/99]
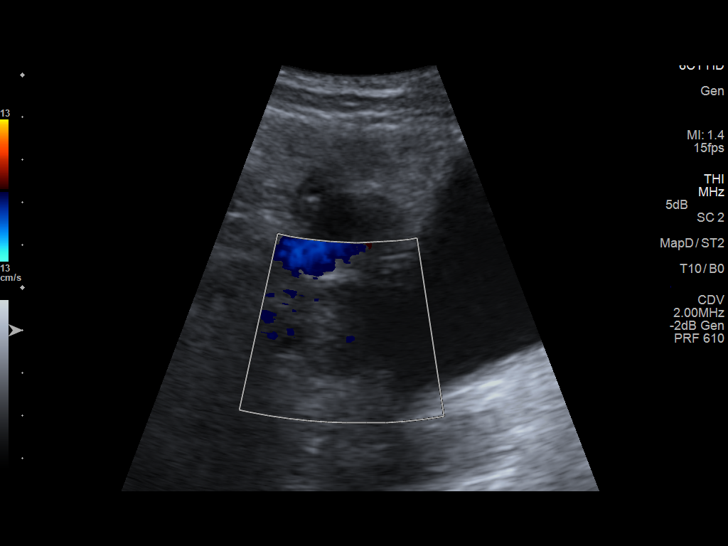
[im 45/99]
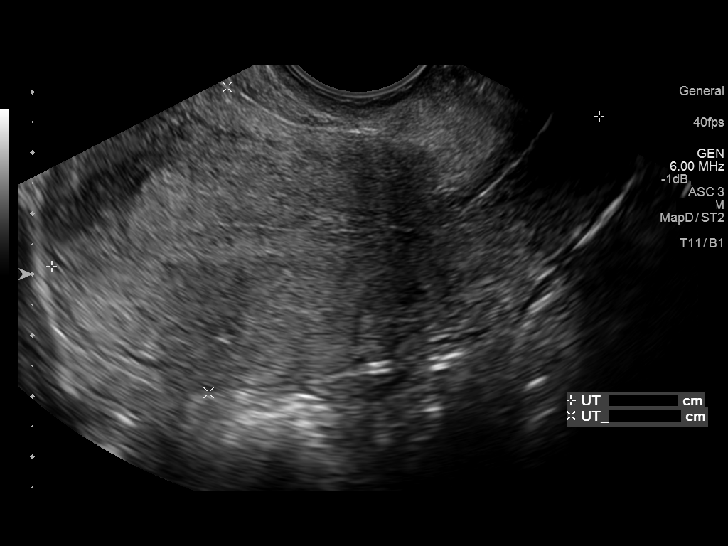
[im 54/99]
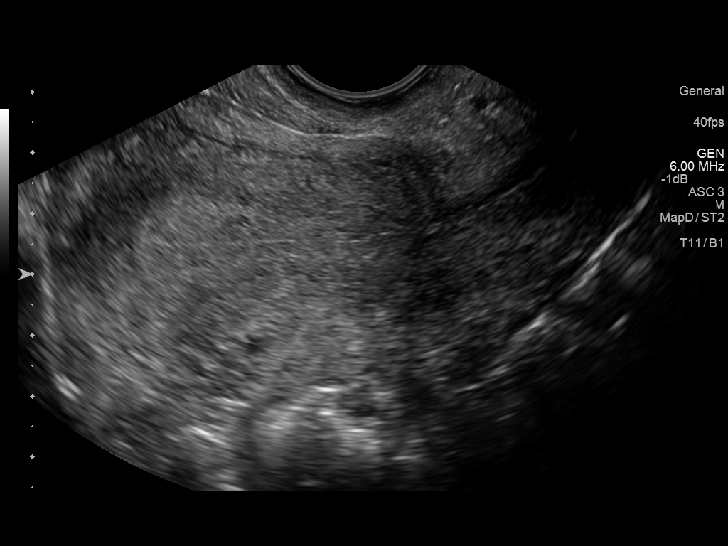
[im 62/99]
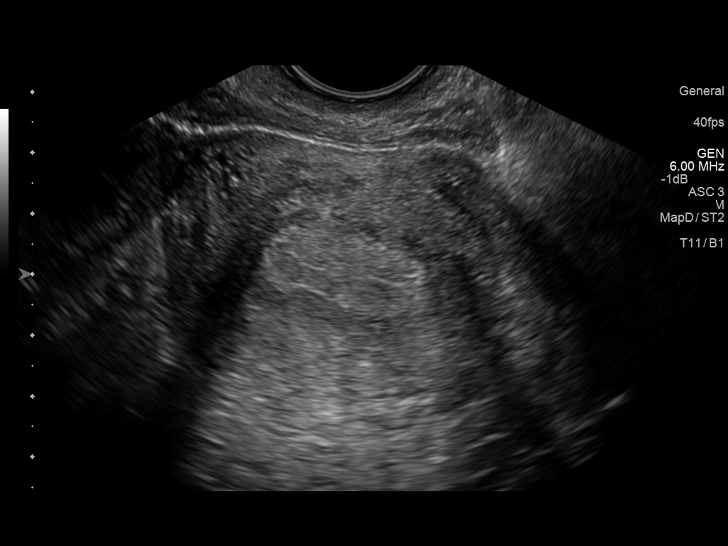
[im 66/99]
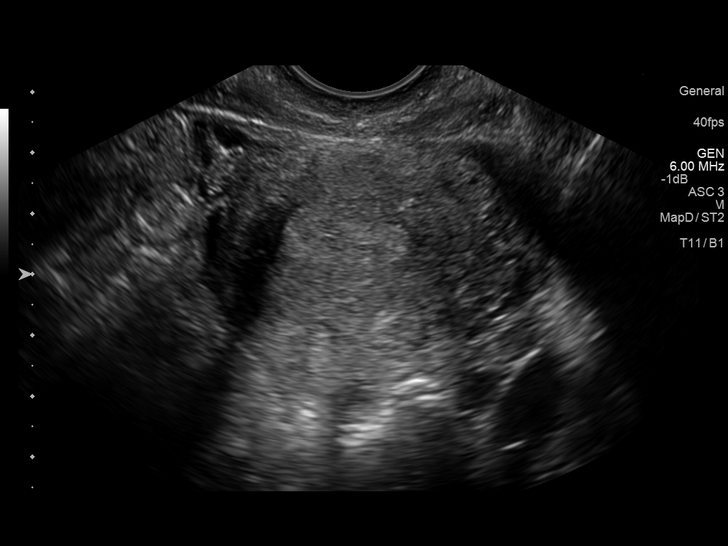
[im 74/99]
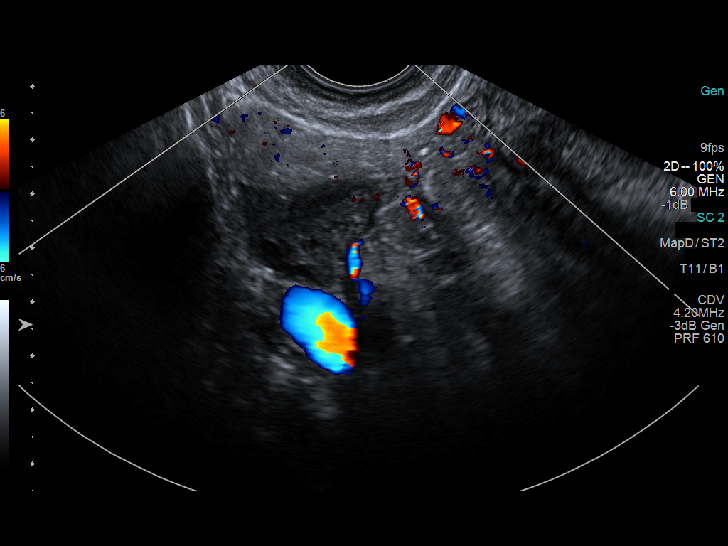
[im 82/99]
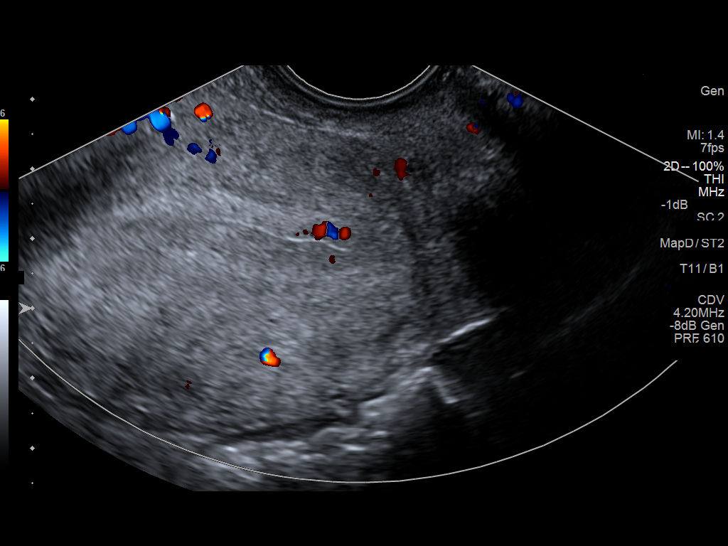
[im 90/99]
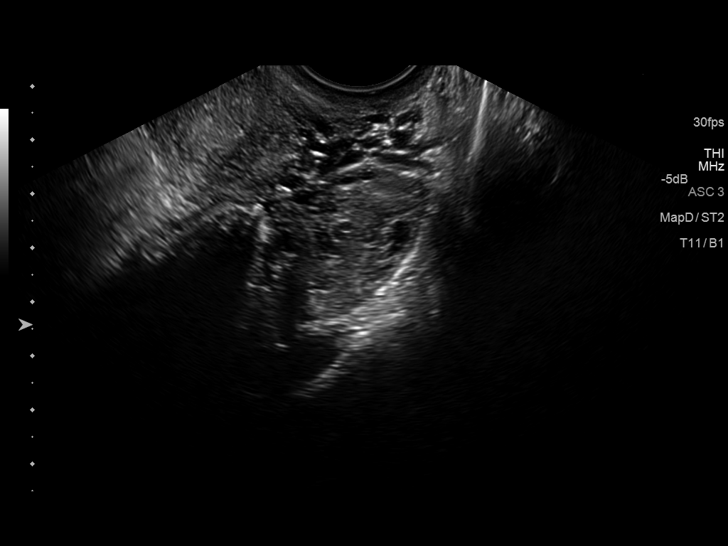
[im 99/99]
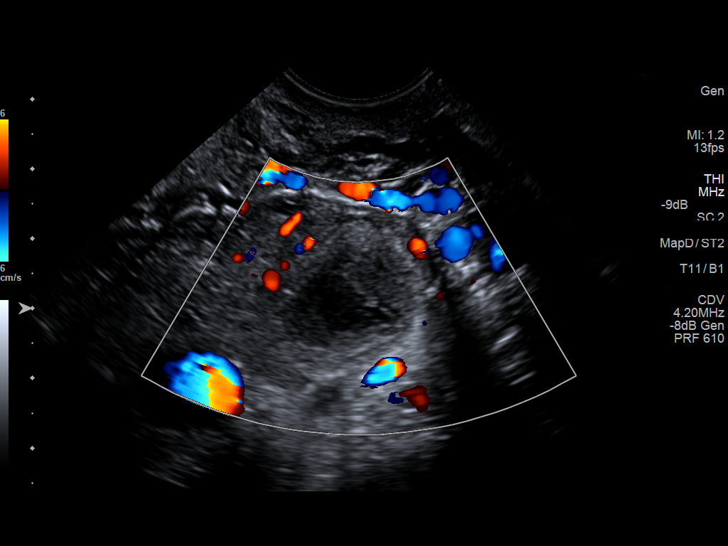

[14 of 25 positions shown; findings below may reference images not displayed]

FINDINGS: Uterus

Measurements: 9.3 x 5.0 x 6.5 cm. Normal morphology without mass

Endometrium

Thickness: 11 mm thick, normal. No endometrial fluid or focal
abnormality.

Right ovary

Measurements: 3.0 x 2.6 x 1.8 cm. Normal morphology without mass.

Left ovary

Measurements: 4.3 x 3.2 x 2.7 cm. Complicated/hemorrhagic follicle
cyst 2.4 x 2.1 x 2.3 cm. No additional mass.

Other findings

Small amount of nonspecific free pelvic fluid.
IMPRESSION: Small complicated/ hemorrhagic follicle cyst LEFT ovary 2.4 cm
greatest size.

Small amount of nonspecific free pelvic fluid.

## 2020-09-05 ENCOUNTER — Emergency Department: Payer: Self-pay

## 2020-09-05 ENCOUNTER — Other Ambulatory Visit: Payer: Self-pay

## 2020-09-05 ENCOUNTER — Emergency Department
Admission: EM | Admit: 2020-09-05 | Discharge: 2020-09-05 | Disposition: A | Discharge status: Other Acute Inpatient Hospital | Payer: Self-pay | Attending: Emergency Medicine | Admitting: Emergency Medicine

## 2020-09-05 ENCOUNTER — Inpatient Hospital Stay (HOSPITAL_COMMUNITY)
Admission: AD | Admit: 2020-09-05 | Discharge: 2020-09-20 | DRG: 020 | Discharge status: Against Medical Advice | Payer: Self-pay | Source: Other Acute Inpatient Hospital | Attending: Neurosurgery | Admitting: Neurosurgery

## 2020-09-05 DIAGNOSIS — I607 Nontraumatic subarachnoid hemorrhage from unspecified intracranial artery: Principal | ICD-10-CM | POA: Diagnosis present

## 2020-09-05 DIAGNOSIS — I609 Nontraumatic subarachnoid hemorrhage, unspecified: Secondary | ICD-10-CM

## 2020-09-05 DIAGNOSIS — E860 Dehydration: Secondary | ICD-10-CM | POA: Diagnosis present

## 2020-09-05 DIAGNOSIS — R319 Hematuria, unspecified: Secondary | ICD-10-CM

## 2020-09-05 DIAGNOSIS — J45909 Unspecified asthma, uncomplicated: Secondary | ICD-10-CM | POA: Insufficient documentation

## 2020-09-05 DIAGNOSIS — Z79899 Other long term (current) drug therapy: Secondary | ICD-10-CM

## 2020-09-05 DIAGNOSIS — I10 Essential (primary) hypertension: Secondary | ICD-10-CM | POA: Diagnosis present

## 2020-09-05 DIAGNOSIS — G038 Meningitis due to other specified causes: Secondary | ICD-10-CM | POA: Diagnosis present

## 2020-09-05 DIAGNOSIS — Z20822 Contact with and (suspected) exposure to covid-19: Secondary | ICD-10-CM | POA: Insufficient documentation

## 2020-09-05 DIAGNOSIS — M545 Low back pain, unspecified: Secondary | ICD-10-CM | POA: Diagnosis present

## 2020-09-05 DIAGNOSIS — E876 Hypokalemia: Secondary | ICD-10-CM | POA: Diagnosis present

## 2020-09-05 DIAGNOSIS — F17200 Nicotine dependence, unspecified, uncomplicated: Secondary | ICD-10-CM | POA: Insufficient documentation

## 2020-09-05 DIAGNOSIS — E871 Hypo-osmolality and hyponatremia: Secondary | ICD-10-CM | POA: Diagnosis present

## 2020-09-05 DIAGNOSIS — R000 Tachycardia, unspecified: Secondary | ICD-10-CM | POA: Diagnosis present

## 2020-09-05 DIAGNOSIS — G8929 Other chronic pain: Secondary | ICD-10-CM | POA: Diagnosis present

## 2020-09-05 DIAGNOSIS — I67848 Other cerebrovascular vasospasm and vasoconstriction: Secondary | ICD-10-CM | POA: Diagnosis present

## 2020-09-05 DIAGNOSIS — F1410 Cocaine abuse, uncomplicated: Secondary | ICD-10-CM | POA: Diagnosis present

## 2020-09-05 DIAGNOSIS — Z6820 Body mass index (BMI) 20.0-20.9, adult: Secondary | ICD-10-CM

## 2020-09-05 DIAGNOSIS — N390 Urinary tract infection, site not specified: Secondary | ICD-10-CM | POA: Insufficient documentation

## 2020-09-05 DIAGNOSIS — E440 Moderate protein-calorie malnutrition: Secondary | ICD-10-CM | POA: Insufficient documentation

## 2020-09-05 LAB — CBC WITH DIFFERENTIAL/PLATELET
Abs Immature Granulocytes: 0.02 10*3/uL (ref 0.00–0.07)
Basophils Absolute: 0 10*3/uL (ref 0.0–0.1)
Basophils Relative: 0 %
Eosinophils Absolute: 0 10*3/uL (ref 0.0–0.5)
Eosinophils Relative: 0 %
HCT: 34.2 % — ABNORMAL LOW (ref 36.0–46.0)
Hemoglobin: 11 g/dL — ABNORMAL LOW (ref 12.0–15.0)
Immature Granulocytes: 0 %
Lymphocytes Relative: 14 %
Lymphs Abs: 1.1 10*3/uL (ref 0.7–4.0)
MCH: 27 pg (ref 26.0–34.0)
MCHC: 32.2 g/dL (ref 30.0–36.0)
MCV: 84 fL (ref 80.0–100.0)
Monocytes Absolute: 0.4 10*3/uL (ref 0.1–1.0)
Monocytes Relative: 6 %
Neutro Abs: 6.3 10*3/uL (ref 1.7–7.7)
Neutrophils Relative %: 80 %
Platelets: 747 10*3/uL — ABNORMAL HIGH (ref 150–400)
RBC: 4.07 MIL/uL (ref 3.87–5.11)
RDW: 15.4 % (ref 11.5–15.5)
WBC: 7.9 10*3/uL (ref 4.0–10.5)
nRBC: 0 % (ref 0.0–0.2)

## 2020-09-05 LAB — URINALYSIS, COMPLETE (UACMP) WITH MICROSCOPIC
RBC / HPF: >50 RBC/hpf — ABNORMAL HIGH (ref 0–5)
Specific Gravity, Urine: 1.011 (ref 1.005–1.030)
WBC, UA: >50 WBC/hpf — ABNORMAL HIGH (ref 0–5)

## 2020-09-05 LAB — COMPREHENSIVE METABOLIC PANEL
ALT: 14 U/L (ref 0–44)
AST: 24 U/L (ref 15–41)
Albumin: 4.2 g/dL (ref 3.5–5.0)
Alkaline Phosphatase: 35 U/L — ABNORMAL LOW (ref 38–126)
Anion gap: 11 (ref 5–15)
BUN: 13 mg/dL (ref 6–20)
CO2: 22 mmol/L (ref 22–32)
Calcium: 9 mg/dL (ref 8.9–10.3)
Chloride: 103 mmol/L (ref 98–111)
Creatinine, Ser: 0.79 mg/dL (ref 0.44–1.00)
GFR, Estimated: >60 mL/min (ref >60)
Glucose, Bld: 127 mg/dL — ABNORMAL HIGH (ref 70–99)
Potassium: 3.4 mmol/L — ABNORMAL LOW (ref 3.5–5.1)
Sodium: 136 mmol/L (ref 135–145)
Total Bilirubin: 0.5 mg/dL (ref 0.3–1.2)
Total Protein: 7.8 g/dL (ref 6.5–8.1)

## 2020-09-05 LAB — CBC
HCT: 35 % — ABNORMAL LOW (ref 36.0–46.0)
Hemoglobin: 11.2 g/dL — ABNORMAL LOW (ref 12.0–15.0)
MCH: 27.2 pg (ref 26.0–34.0)
MCHC: 32 g/dL (ref 30.0–36.0)
MCV: 85 fL (ref 80.0–100.0)
Platelets: 734 10*3/uL — ABNORMAL HIGH (ref 150–400)
RBC: 4.12 MIL/uL (ref 3.87–5.11)
RDW: 15.7 % — ABNORMAL HIGH (ref 11.5–15.5)
WBC: 7.5 10*3/uL (ref 4.0–10.5)
nRBC: 0 % (ref 0.0–0.2)

## 2020-09-05 LAB — PROTIME-INR
INR: 1.1 (ref 0.8–1.2)
Prothrombin Time: 13.3 seconds (ref 11.4–15.2)

## 2020-09-05 LAB — HIV ANTIBODY (ROUTINE TESTING W REFLEX): HIV Screen 4th Generation wRfx: NONREACTIVE

## 2020-09-05 LAB — RESP PANEL BY RT-PCR (FLU A&B, COVID) ARPGX2
Influenza A by PCR: NEGATIVE
Influenza B by PCR: NEGATIVE
SARS Coronavirus 2 by RT PCR: NEGATIVE

## 2020-09-05 LAB — SEDIMENTATION RATE: Sed Rate: 33 mm/hr — ABNORMAL HIGH (ref 0–20)

## 2020-09-05 LAB — MRSA PCR SCREENING: MRSA by PCR: NEGATIVE

## 2020-09-05 LAB — APTT: aPTT: 34 seconds (ref 24–36)

## 2020-09-05 MED ORDER — SODIUM CHLORIDE 0.9 % IV SOLN: INTRAVENOUS | Status: DC

## 2020-09-05 MED ORDER — NIMODIPINE 6 MG/ML PO SOLN
60.0000 mg | ORAL | Status: DC
  Filled 2020-09-05: qty 10

## 2020-09-05 MED ORDER — LEVETIRACETAM IN NACL 500 MG/100ML IV SOLN
500.0000 mg | Freq: Two times a day (BID) | INTRAVENOUS | Status: DC
  Administered 2020-09-05 – 2020-09-07 (×4): 500 mg via INTRAVENOUS
  Filled 2020-09-05 (×4): qty 100

## 2020-09-05 MED ORDER — ONDANSETRON 4 MG PO TBDP: 4.0000 mg | ORAL_TABLET | Freq: Four times a day (QID) | ORAL | Status: DC | PRN

## 2020-09-05 MED ORDER — PANTOPRAZOLE SODIUM 40 MG PO PACK: 40.0000 mg | PACK | Freq: Every day | ORAL | Status: DC

## 2020-09-05 MED ORDER — HYDROMORPHONE HCL 1 MG/ML IJ SOLN
0.5000 mg | INTRAMUSCULAR | Status: DC | PRN
Start: 2020-09-05 — End: 2020-09-10
  Administered 2020-09-05 – 2020-09-09 (×11): 1 mg via INTRAVENOUS
  Filled 2020-09-05 (×11): qty 1

## 2020-09-05 MED ORDER — ACETAMINOPHEN 325 MG PO TABS
650.0000 mg | ORAL_TABLET | ORAL | Status: DC | PRN
  Administered 2020-09-10 (×2): 650 mg via ORAL
  Filled 2020-09-05 (×2): qty 2

## 2020-09-05 MED ORDER — LEVETIRACETAM IN NACL 500 MG/100ML IV SOLN
500.0000 mg | Freq: Once | INTRAVENOUS | Status: AC
  Administered 2020-09-05: 500 mg via INTRAVENOUS
  Filled 2020-09-05: qty 100

## 2020-09-05 MED ORDER — STROKE: EARLY STAGES OF RECOVERY BOOK
Freq: Once | Status: AC
  Filled 2020-09-05: qty 1

## 2020-09-05 MED ORDER — ONDANSETRON HCL 4 MG/2ML IJ SOLN
INTRAMUSCULAR | Status: AC
  Administered 2020-09-05: 4 mg via INTRAVENOUS
  Filled 2020-09-05: qty 2

## 2020-09-05 MED ORDER — IOHEXOL 350 MG/ML SOLN
75.0000 mL | Freq: Once | INTRAVENOUS | Status: AC | PRN
Start: 2020-09-05 — End: 2020-09-05
  Administered 2020-09-05: 75 mL via INTRAVENOUS
  Filled 2020-09-05: qty 75

## 2020-09-05 MED ORDER — NIMODIPINE 30 MG PO CAPS
60.0000 mg | ORAL_CAPSULE | ORAL | Status: DC
  Administered 2020-09-05 – 2020-09-14 (×51): 60 mg via ORAL
  Filled 2020-09-05 (×52): qty 2

## 2020-09-05 MED ORDER — ACETAMINOPHEN 160 MG/5ML PO SOLN: 650.0000 mg | ORAL | Status: DC | PRN

## 2020-09-05 MED ORDER — NIMODIPINE 30 MG PO CAPS
60.0000 mg | ORAL_CAPSULE | ORAL | Status: DC
  Administered 2020-09-05: 60 mg via ORAL
  Filled 2020-09-05 (×6): qty 2

## 2020-09-05 MED ORDER — LABETALOL HCL 5 MG/ML IV SOLN: 10.0000 mg | INTRAVENOUS | Status: DC | PRN

## 2020-09-05 MED ORDER — OXYCODONE HCL 5 MG PO TABS
5.0000 mg | ORAL_TABLET | ORAL | Status: DC | PRN
  Administered 2020-09-05 – 2020-09-11 (×15): 10 mg via ORAL
  Filled 2020-09-05 (×18): qty 2

## 2020-09-05 MED ORDER — ACETAMINOPHEN 500 MG PO TABS
1000.0000 mg | ORAL_TABLET | Freq: Once | ORAL | Status: AC
  Administered 2020-09-05: 1000 mg via ORAL
  Filled 2020-09-05: qty 2

## 2020-09-05 MED ORDER — PANTOPRAZOLE SODIUM 40 MG PO TBEC
40.0000 mg | DELAYED_RELEASE_TABLET | Freq: Every day | ORAL | Status: DC
  Administered 2020-09-05 – 2020-09-20 (×16): 40 mg via ORAL
  Filled 2020-09-05 (×16): qty 1

## 2020-09-05 MED ORDER — HYDRALAZINE HCL 20 MG/ML IJ SOLN: 5.0000 mg | INTRAMUSCULAR | Status: DC | PRN

## 2020-09-05 MED ORDER — ACETAMINOPHEN 650 MG RE SUPP: 650.0000 mg | RECTAL | Status: DC | PRN

## 2020-09-05 MED ORDER — ONDANSETRON HCL 4 MG/2ML IJ SOLN: 4.0000 mg | Freq: Once | INTRAMUSCULAR | Status: AC

## 2020-09-05 MED ORDER — MORPHINE SULFATE (PF) 2 MG/ML IV SOLN
2.0000 mg | Freq: Once | INTRAVENOUS | Status: AC
Start: 2020-09-05 — End: 2020-09-05
  Administered 2020-09-05: 2 mg via INTRAVENOUS
  Filled 2020-09-05: qty 1

## 2020-09-05 MED ORDER — MORPHINE SULFATE (PF) 4 MG/ML IV SOLN
4.0000 mg | INTRAVENOUS | Status: DC | PRN
  Administered 2020-09-05: 4 mg via INTRAVENOUS
  Filled 2020-09-05: qty 1

## 2020-09-05 MED ORDER — ONDANSETRON HCL 4 MG/2ML IJ SOLN
4.0000 mg | Freq: Four times a day (QID) | INTRAMUSCULAR | Status: DC | PRN
  Administered 2020-09-06 – 2020-09-14 (×6): 4 mg via INTRAVENOUS
  Filled 2020-09-05 (×6): qty 2

## 2020-09-05 MED ORDER — DOCUSATE SODIUM 100 MG PO CAPS
100.0000 mg | ORAL_CAPSULE | Freq: Two times a day (BID) | ORAL | Status: DC
  Administered 2020-09-05 – 2020-09-19 (×24): 100 mg via ORAL
  Filled 2020-09-05 (×28): qty 1

## 2020-09-05 NOTE — ED Notes (Signed)
MD Cyril Loosen made aware of BP 11/70 held nimodipine per md request.

## 2020-09-05 NOTE — Plan of Care (Signed)
  Problem: Education: Goal: Knowledge of disease or condition will improve Outcome: Progressing   

## 2020-09-05 NOTE — ED Triage Notes (Signed)
Pt c/o head and neck pain since last night, denies any injury or fever.

## 2020-09-05 NOTE — H&P (Signed)
Chief Complaint   No chief complaint on file.   HPI   Consult requested by: EDP [facility] Reason for consult: SAH, ruptured aneurysm  HPI: Lori Pierce is a 45 y.o. female with history of asthma and allergies who presented to the ED with worst headache of life. Yesterday evening shortly after dinner, patient developed a severe right temporal headache. HA continued to worsen and she started to develop severe neck pain along with the HA. She tried ibuprofen and tylenol without relief. Because of severity of symptoms, she presented to the ED. 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 NSY was called and patient was transferred to the Neuro ICU at [facility] for further work up and management. I met patient upon arrival to [facility]. She complains of severe frontal HA and neck pain. No associated changes in vision, N/T/W. She is not on any blood thinning medications.  She has no family history of ruptured aneurysms. 25 pack year history.  Patient Active Problem List   Diagnosis Date Noted  . 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 Anemia   . Asthma     PSH: No past surgical history on file.  Medications Prior to Admission  Medication Sig Dispense Refill Last Dose  . doxycycline (VIBRA-TABS) 100 MG tablet Take 1 tablet (100 mg total) by mouth 2 (two) times daily. 20 tablet 0   . HYDROcodone-acetaminophen (NORCO) 5-325 MG per tablet Take 1 tablet by mouth every 4 (four) hours as needed for moderate pain. 12 tablet 0   . tobramycin (TOBREX) 0.3 % ophthalmic solution Place 2 drops into the right eye every 4 (four) hours. 5 mL 0     SH: Social History   Tobacco Use  . Smoking status: Current Every Day Smoker  . Smokeless tobacco: Never Used  Substance Use Topics  . Alcohol use: Yes  . Drug use: Not Currently    MEDS: Prior to Admission medications    Medication Sig Start Date End Date Taking? Authorizing Provider  doxycycline (VIBRA-TABS) 100 MG tablet Take 1 tablet (100 mg total) by mouth 2 (two) times daily. 12/08/14   Harvest Dark, MD  HYDROcodone-acetaminophen (NORCO) 5-325 MG per tablet Take 1 tablet by mouth every 4 (four) hours as needed for moderate pain. 12/08/14   Harvest Dark, MD  tobramycin (TOBREX) 0.3 % ophthalmic solution Place 2 drops into the right eye every 4 (four) hours. 02/03/15   Johnn Hai, PA-C    ALLERGY: No Known Allergies  Social History   Tobacco Use  . Smoking status: Current Every Day Smoker  . Smokeless tobacco: Never Used  Substance Use Topics  . Alcohol use: Yes     No family history on file.   ROS   Review of Systems  All other systems reviewed and are negative.   Exam   There were no vitals filed for this visit. General appearance: WDWN, NAD GCS 15 Eyes: No scleral injection Cardiovascular: Regular rate and rhythm without murmurs, rubs, gallops. No edema or variciosities. Distal pulses normal. Pulmonary: Effort normal, non-labored breathing Musculoskeletal:     Muscle tone upper extremities: Normal    Muscle tone lower extremities: Normal    Motor exam: Upper Extremities Deltoid Bicep Tricep Grip  Right 5/5 5/5 5/5 5/5  Left 5/5 5/5 5/5 5/5   Lower Extremity IP Quad PF DF  EHL  Right 5/5 5/5 5/5 5/5 5/5  Left 5/5 5/5 5/5 5/5 5/5   Neurological Mental Status:    - Patient is awake, alert, oriented to person, place, month, year, and situation    - Patient is able to give a clear and coherent history.    - No signs of aphasia or neglect Cranial Nerves    - II: Visual Fields are full. PERRL    - III/IV/VI: EOMI without ptosis or diploplia.     - V: Facial sensation is grossly normal    - VII: Facial movement is symmetric.     - VIII: hearing is intact to voice    - X: Uvula elevates symmetrically    - XI: Shoulder shrug is symmetric.    - XII: tongue is  midline without atrophy or fasciculations.  Sensory: Sensation grossly intact to LT No drift  Results - Imaging/Labs   Results for orders placed or performed during the hospital encounter of 09/05/20 (from the past 48 hour(s))  CBC with Differential     Status: Abnormal   Collection Time: 09/05/20  7:26 AM  Result Value Ref Range   WBC 7.9 4.0 - 10.5 K/uL   RBC 4.07 3.87 - 5.11 MIL/uL   Hemoglobin 11.0 (L) 12.0 - 15.0 g/dL   HCT 34.2 (L) 36.0 - 46.0 %   MCV 84.0 80.0 - 100.0 fL   MCH 27.0 26.0 - 34.0 pg   MCHC 32.2 30.0 - 36.0 g/dL   RDW 15.4 11.5 - 15.5 %   Platelets 747 (H) 150 - 400 K/uL   nRBC 0.0 0.0 - 0.2 %   Neutrophils Relative % 80 %   Neutro Abs 6.3 1.7 - 7.7 K/uL   Lymphocytes Relative 14 %   Lymphs Abs 1.1 0.7 - 4.0 K/uL   Monocytes Relative 6 %   Monocytes Absolute 0.4 0.1 - 1.0 K/uL   Eosinophils Relative 0 %   Eosinophils Absolute 0.0 0.0 - 0.5 K/uL   Basophils Relative 0 %   Basophils Absolute 0.0 0.0 - 0.1 K/uL   Immature Granulocytes 0 %   Abs Immature Granulocytes 0.02 0.00 - 0.07 K/uL    Comment: Performed at [facility], [address] [place], [place] [address]  Comprehensive metabolic panel     Status: Abnormal   Collection Time: 09/05/20  7:26 AM  Result Value Ref Range   Sodium 136 135 - 145 mmol/L   Potassium 3.4 (L) 3.5 - 5.1 mmol/L   Chloride 103 98 - 111 mmol/L   CO2 22 22 - 32 mmol/L   Glucose, Bld 127 (H) 70 - 99 mg/dL    Comment: Glucose reference range applies only to samples taken after fasting for at least 8 hours.   BUN 13 6 - 20 mg/dL   Creatinine, Ser 0.79 0.44 - 1.00 mg/dL   Calcium 9.0 8.9 - 10.3 mg/dL   Total Protein 7.8 6.5 - 8.1 g/dL   Albumin 4.2 3.5 - 5.0 g/dL   AST 24 15 - 41 U/L   ALT 14 0 - 44 U/L   Alkaline Phosphatase 35 (L) 38 - 126 U/L   Total Bilirubin 0.5 0.3 - 1.2 mg/dL   GFR, Estimated >60 >60 mL/min    Comment: (NOTE) Calculated using the CKD-EPI Creatinine Equation (2021)    Anion gap 11 5 -  15    Comment: Performed at [facility], [address], [place], [place] [address]  Sedimentation rate  Status: Abnormal   Collection Time: 09/05/20  7:26 AM  Result Value Ref Range   Sed Rate 33 (H) 0 - 20 mm/hr    Comment: Performed at [facility], [address] [place], [place] [address]  Urinalysis, Complete w Microscopic Urine, Clean Catch     Status: Abnormal   Collection Time: 09/05/20  9:03 AM  Result Value Ref Range   Color, Urine RED (A) YELLOW   APPearance CLOUDY (A) CLEAR   Specific Gravity, Urine 1.011 1.005 - 1.030   pH  5.0 - 8.0    TEST NOT REPORTED DUE TO COLOR INTERFERENCE OF URINE PIGMENT   Glucose, UA (A) NEGATIVE mg/dL    TEST NOT REPORTED DUE TO COLOR INTERFERENCE OF URINE PIGMENT   Hgb urine dipstick (A) NEGATIVE    TEST NOT REPORTED DUE TO COLOR INTERFERENCE OF URINE PIGMENT   Bilirubin Urine (A) NEGATIVE    TEST NOT REPORTED DUE TO COLOR INTERFERENCE OF URINE PIGMENT   Ketones, ur (A) NEGATIVE mg/dL    TEST NOT REPORTED DUE TO COLOR INTERFERENCE OF URINE PIGMENT   Protein, ur (A) NEGATIVE mg/dL    TEST NOT REPORTED DUE TO COLOR INTERFERENCE OF URINE PIGMENT   Nitrite (A) NEGATIVE    TEST NOT REPORTED DUE TO COLOR INTERFERENCE OF URINE PIGMENT   Leukocytes,Ua (A) NEGATIVE    TEST NOT REPORTED DUE TO COLOR INTERFERENCE OF URINE PIGMENT   RBC / HPF >50 (H) 0 - 5 RBC/hpf   WBC, UA >50 (H) 0 - 5 WBC/hpf   Bacteria, UA RARE (A) NONE SEEN   Squamous Epithelial / LPF 6-10 0 - 5   WBC Clumps PRESENT    Mucus PRESENT     Comment: Performed at [facility], [address], [place], [place] [address]  Resp Panel by RT-PCR (Flu A&B, Covid) Nasopharyngeal Swab     Status: None   Collection Time: 09/05/20  5:05 PM   Specimen: Nasopharyngeal Swab; Nasopharyngeal(NP) swabs in vial transport medium  Result Value Ref Range   SARS Coronavirus 2 by RT PCR NEGATIVE NEGATIVE    Comment: (NOTE) SARS-CoV-2 target nucleic acids are NOT  DETECTED.  The SARS-CoV-2 RNA is generally detectable in upper respiratory specimens during the acute phase of infection. The lowest concentration of SARS-CoV-2 viral copies this assay can detect is 138 copies/mL. A negative result does not preclude SARS-Cov-2 infection and should not be used as the sole basis for treatment or other patient management decisions. A negative result may occur with  improper specimen collection/handling, submission of specimen other than nasopharyngeal swab, presence of viral mutation(s) within the areas targeted by this assay, and inadequate number of viral copies(<138 copies/mL). A negative result must be combined with clinical observations, patient history, and epidemiological information. The expected result is Negative.  Fact Sheet for Patients:  EntrepreneurPulse.com.au  Fact Sheet for Healthcare Providers:  IncredibleEmployment.be  This test is no t yet approved or cleared by the [place] FDA and  has been authorized for detection and/or diagnosis of SARS-CoV-2 by FDA under an Emergency Use Authorization (EUA). This EUA will remain  in effect (meaning this test can be used) for the duration of the COVID-19 declaration under Section 564(b)(1) of the Act, 21 U.S.C.section 360bbb-3(b)(1), unless the authorization is terminated  or revoked sooner.       Influenza A by PCR NEGATIVE NEGATIVE   Influenza B by PCR NEGATIVE NEGATIVE    Comment: (NOTE) The Xpert Xpress SARS-CoV-2/FLU/RSV plus  assay is intended as an aid in the diagnosis of influenza from Nasopharyngeal swab specimens and should not be used as a sole basis for treatment. Nasal washings and aspirates are unacceptable for Xpert Xpress SARS-CoV-2/FLU/RSV testing.  Fact Sheet for Patients: EntrepreneurPulse.com.au  Fact Sheet for Healthcare Providers: IncredibleEmployment.be  This test is not yet approved or  cleared by the [place] FDA and has been authorized for detection and/or diagnosis of SARS-CoV-2 by FDA under an Emergency Use Authorization (EUA). This EUA will remain in effect (meaning this test can be used) for the duration of the COVID-19 declaration under Section 564(b)(1) of the Act, 21 U.S.C. section 360bbb-3(b)(1), unless the authorization is terminated or revoked.  Performed at [facility], [place], [place] [address]     CT Angio Head W or Wo Contrast  Result Date: 09/05/2020 CLINICAL DATA:  Suspected subarachnoid hemorrhage EXAM: CT ANGIOGRAPHY HEAD TECHNIQUE: Multidetector CT imaging of the head was performed using the standard protocol during bolus administration of intravenous contrast. Multiplanar CT image reconstructions and MIPs were obtained to evaluate the vascular anatomy. CONTRAST:  72m OMNIPAQUE IOHEXOL 350 MG/ML SOLN COMPARISON:  Head CT from earlier today FINDINGS: CTA HEAD Anterior circulation: 5 x 5.3 mm right supraclinoid ICA aneurysm which is downward and posterior pointing with mild sac lobulation. This is likely the symptomatic finding. Just proximal and medial is an inferior outpouching measuring 2 mm, not well resolved separate from the adjacent posterior clinoid. No branch occlusion or generalized beading Posterior circulation: No significant stenosis, proximal occlusion, aneurysm, or vascular malformation. Venous sinuses: As permitted by contrast timing, patent. Anatomic variants: None significant IMPRESSION: 1. 5 mm right supraclinoid ICA aneurysm, presumably the symptomatic lesion. 2. 2 mm right supraclinoid ICA outpouching which could be infundibulum or aneurysm, attention on catheter angiography. Electronically Signed   By: JMonte FantasiaM.D.   On: 09/05/2020 10:43   CT Head Wo Contrast  Result Date: 09/05/2020 CLINICAL DATA:  New or worsening headache, positional. Symptoms began last night suddenly EXAM: CT HEAD WITHOUT  CONTRAST CT CERVICAL SPINE WITHOUT CONTRAST TECHNIQUE: Multidetector CT imaging of the head and cervical spine was performed following the standard protocol without intravenous contrast. Multiplanar CT image reconstructions of the cervical spine were also generated. COMPARISON:  None. FINDINGS: CT HEAD FINDINGS Brain: High-density in the basal cisterns, attributed to subarachnoid hemorrhage in the setting of sudden onset headache. The distribution is symmetric. Borderline ventriculomegaly at the temporal horns, but no clear obstructive hydrocephalus. Negative for infarct, mass, or atrophy. 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 Vascular: No focal vessel hyperdensity Skull: Negative Sinuses/Orbits: Negative Other: Critical Value/emergent results were called by telephone at the time of interpretation on 09/05/2020 at 9:27 am to [facility], who verbally acknowledged these results. CT CERVICAL SPINE FINDINGS Alignment: Normal Skull base and vertebrae: Negative for fracture or bone lesion Soft tissues and spinal canal: No prevertebral fluid or swelling. No visible canal hematoma.Enlarged thyroid with nodule on the left that is soft tissue density and a low-density 3.7 cm nodule in the right that is partially covered. Disc levels:  No significant degenerative changes Upper chest: Negative IMPRESSION: 1. Subarachnoid hemorrhage in the basal cisterns, recommend CTA to evaluate for aneurysmal source. 2. Mild prominence of the temporal horns, recommend follow-up. 3. Negative cervical spine. 4. Nodular thyroid. Recommend thyroid [place](ref: J Am Coll Radiol. 2015 Feb;12(2): 143-50). Electronically Signed   By: JMonte FantasiaM.D.   On: 09/05/2020 09:30  CT Cervical Spine Wo Contrast  Result Date: 09/05/2020 CLINICAL DATA:  New or worsening headache, positional. Symptoms began last night suddenly EXAM: CT HEAD WITHOUT CONTRAST CT CERVICAL SPINE WITHOUT CONTRAST TECHNIQUE: Multidetector CT  imaging of the head and cervical spine was performed following the standard protocol without intravenous contrast. Multiplanar CT image reconstructions of the cervical spine were also generated. COMPARISON:  None. FINDINGS: CT HEAD FINDINGS Brain: High-density in the basal cisterns, attributed to subarachnoid hemorrhage in the setting of sudden onset headache. The distribution is symmetric. Borderline ventriculomegaly at the temporal horns, but no clear obstructive hydrocephalus. Negative for infarct, mass, or atrophy. 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 Vascular: No focal vessel hyperdensity Skull: Negative Sinuses/Orbits: Negative Other: Critical Value/emergent results were called by telephone at the time of interpretation on 09/05/2020 at 9:27 am to provider [facility] , who verbally acknowledged these results. CT CERVICAL SPINE FINDINGS Alignment: Normal Skull base and vertebrae: Negative for fracture or bone lesion Soft tissues and spinal canal: No prevertebral fluid or swelling. No visible canal hematoma.Enlarged thyroid with nodule on the left that is soft tissue density and a low-density 3.7 cm nodule in the right that is partially covered. Disc levels:  No significant degenerative changes Upper chest: Negative IMPRESSION: 1. Subarachnoid hemorrhage in the basal cisterns, recommend CTA to evaluate for aneurysmal source. 2. Mild prominence of the temporal horns, recommend follow-up. 3. Negative cervical spine. 4. Nodular thyroid. Recommend thyroid US (ref: J Am Coll Radiol. 2015 Feb;12(2): 143-50). Electronically Signed   By: Monte Fantasia M.D.   On: 09/05/2020 09:30   Impression/Plan   45 y.o. 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 She is neurologically intact. No emergent indication for NS intervention at present. She is admitted to Neuro ICU for further work up and management.    Plan for diagnostic cerebral angiogram and appropriate treatment of aneurysm tomorrow am.  - passed bedside swallow. Okay for diet. NPO at midnight - nimotop for vasospasm prevention - keppra for seizure prophylaxis - frequent neuro checks, report any change - TCDs M/W/F  COVID screening -  negative  Ferne Reus, PA-C [facility]

## 2020-09-05 NOTE — ED Notes (Signed)
Pt out to hallway asking for snack machine- pt visibly upset stating her head hurt and she needed either something to eat or some pain medications- this RN told pt that I would ask the doctor if she could have something to eat- pt began to walk down hallway towards exit- this RN asked pt to come back to room so I could give her medication- pt back to room and medication given

## 2020-09-05 NOTE — ED Notes (Signed)
Patient is resting comfortably. 

## 2020-09-05 NOTE — ED Notes (Signed)
Report given to [facility] 

## 2020-09-05 NOTE — ED Notes (Signed)
Report given to [facility]. Report called to receiving RN Melissa at [facility]

## 2020-09-05 NOTE — ED Notes (Signed)
[facility] at bedside 

## 2020-09-05 NOTE — ED Notes (Signed)
Pt reports HA since lastnight that starts at the base of her neck and comes around to her temples. Pt reports not relieved with OTC tylenol and ibuprofen. Denies fevers or other symptoms, reports just a headache

## 2020-09-05 NOTE — ED Notes (Signed)
Urine pregnancy results negative, physician aware. Unable to load results in chart

## 2020-09-05 NOTE — Consult Note (Signed)
Consult received.  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  Preliminary recommendations:  - nimodipine 30 q2, keppra 500 mg, SBP<140 - transfer to higher level of care for neuro ICU and neurovascular coverage

## 2020-09-05 NOTE — ED Provider Notes (Signed)
[facility] Emergency Department Provider Note  ____________________________________________   Event Date/Time   First MD Initiated Contact with Patient 09/05/20 [phone]     (approximate)  I have reviewed the triage vital signs and the nursing notes.   HISTORY  Chief Complaint Headache   HPI Lori Pierce is a 45 y.o. female Modena Jansky to the ED with complaint of headache that began last night while she was eating dinner.  Patient states that the headache started at the right temple and then became generalized with light sensitivity and nausea.  Patient denies any vomiting.  She denies any blurred vision, fever, chills, loss of taste or smell.  She states she has taken both ibuprofen and Tylenol without any relief of her headache.  Patient denies previous history of headaches.  She also reports that this morning while trying to have a bowel movement to strain increased her headache enough that she had to stop.  Currently she rates her pain as a 10/10.         Past Medical History:  Diagnosis Date  . Anemia   . Asthma     There are no problems to display for this patient.   History reviewed. No pertinent surgical history.  Prior to Admission medications   Medication Sig Start Date End Date Taking? Authorizing Provider  doxycycline (VIBRA-TABS) 100 MG tablet Take 1 tablet (100 mg total) by mouth 2 (two) times daily. 12/08/14   Minna Antis, MD  HYDROcodone-acetaminophen (NORCO) 5-325 MG per tablet Take 1 tablet by mouth every 4 (four) hours as needed for moderate pain. 12/08/14   Minna Antis, MD  tobramycin (TOBREX) 0.3 % ophthalmic solution Place 2 drops into the right eye every 4 (four) hours. 02/03/15   Tommi Rumps, PA-C    Allergies Patient has no known allergies.  No family history on file.  Social History Social History   Tobacco Use  . Smoking status: Current Every Day Smoker  . Smokeless tobacco: Never Used  Substance Use  Topics  . Alcohol use: Yes  . Drug use: Not Currently    Review of Systems Constitutional: No fever/chills Eyes: No visual changes.  Light sensitivity.  No blurred vision. ENT: No complaints. Cardiovascular: Denies chest pain. Respiratory: Denies shortness of breath. Gastrointestinal: No abdominal pain.  Positive nausea, no vomiting.  No diarrhea.   Musculoskeletal: Positive for cervical pain without history of injury. Skin: Negative for rash. Neurological: Positive for headache.  Negative for focal weakness or numbness.   ____________________________________________   PHYSICAL EXAM:  VITAL SIGNS: ED Triage Vitals [09/05/20 0730]  Enc Vitals Group     BP (!) 141/94     Pulse Rate (!) 106     Resp 18     Temp 98.4 F (36.9 C)     Temp Source Oral     SpO2 100 %     Weight      Height      Head Circumference      Peak Flow      Pain Score      Pain Loc      Pain Edu?      Excl. in GC?     Constitutional: Alert and oriented. Well appearing and in no acute distress. Eyes: Conjunctivae are normal. PERRL. EOMI. Head: Atraumatic. Nose: No congestion/rhinnorhea. Mouth/Throat: Mucous membranes are moist.  Oropharynx non-erythematous. Neck: No stridor.  No tenderness on palpation of cervical spine however range of motion in flexion is decreased  due to discomfort. Hematological/Lymphatic/Immunilogical: No cervical lymphadenopathy. Cardiovascular: Normal rate, regular rhythm. Grossly normal heart sounds.  Good peripheral circulation. Respiratory: Normal respiratory effort.  No retractions. Lungs CTAB. Musculoskeletal: Moves upper and lower extremities with any difficulty.  No tenderness noted on palpation of the thoracic or lumbar spine. Neurologic:  Normal speech and language.  Cranial nerves II through XII grossly intact.  No gross focal neurologic deficits are appreciated.  Skin:  Skin is warm, dry and intact. No rash noted. Psychiatric: Mood and affect are normal. Speech  and behavior are normal.  ____________________________________________   LABS (all labs ordered are listed, but only abnormal results are displayed)  Labs Reviewed  CBC WITH DIFFERENTIAL/PLATELET - Abnormal; Notable for the following components:      Result Value   Hemoglobin 11.0 (*)    HCT 34.2 (*)    Platelets 747 (*)    All other components within normal limits  COMPREHENSIVE METABOLIC PANEL - Abnormal; Notable for the following components:   Potassium 3.4 (*)    Glucose, Bld 127 (*)    Alkaline Phosphatase 35 (*)    All other components within normal limits  SEDIMENTATION RATE - Abnormal; Notable for the following components:   Sed Rate 33 (*)    All other components within normal limits  URINALYSIS, COMPLETE (UACMP) WITH MICROSCOPIC - Abnormal; Notable for the following components:   Color, Urine RED (*)    APPearance CLOUDY (*)    Glucose, UA   (*)    Value: TEST NOT REPORTED DUE TO COLOR INTERFERENCE OF URINE PIGMENT   Hgb urine dipstick   (*)    Value: TEST NOT REPORTED DUE TO COLOR INTERFERENCE OF URINE PIGMENT   Bilirubin Urine   (*)    Value: TEST NOT REPORTED DUE TO COLOR INTERFERENCE OF URINE PIGMENT   Ketones, ur   (*)    Value: TEST NOT REPORTED DUE TO COLOR INTERFERENCE OF URINE PIGMENT   Protein, ur   (*)    Value: TEST NOT REPORTED DUE TO COLOR INTERFERENCE OF URINE PIGMENT   Nitrite   (*)    Value: TEST NOT REPORTED DUE TO COLOR INTERFERENCE OF URINE PIGMENT   Leukocytes,Ua   (*)    Value: TEST NOT REPORTED DUE TO COLOR INTERFERENCE OF URINE PIGMENT   RBC / HPF >50 (*)    WBC, UA >50 (*)    Bacteria, UA RARE (*)    All other components within normal limits  POC URINE PREG, ED   ____________________________________________  EKG   ____________________________________________  RADIOLOGY Beaulah Corin, personally viewed and evaluated these images (plain radiographs) as part of my medical decision making, as well as reviewing the written  report by the radiologist.   Official radiology report(s): CT Head Wo Contrast  Result Date: 09/05/2020 CLINICAL DATA:  New or worsening headache, positional. Symptoms began last night suddenly EXAM: CT HEAD WITHOUT CONTRAST CT CERVICAL SPINE WITHOUT CONTRAST TECHNIQUE: Multidetector CT imaging of the head and cervical spine was performed following the standard protocol without intravenous contrast. Multiplanar CT image reconstructions of the cervical spine were also generated. COMPARISON:  None. FINDINGS: CT HEAD FINDINGS Brain: High-density in the basal cisterns, attributed to subarachnoid hemorrhage in the setting of sudden onset headache. The distribution is symmetric. Borderline ventriculomegaly at the temporal horns, but no clear obstructive hydrocephalus. Negative for infarct, mass, or atrophy. 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 Vascular: No focal vessel hyperdensity Skull: Negative Sinuses/Orbits: Negative Other: Critical Value/emergent  results were called by telephone at the time of interpretation on 09/05/2020 at 9:27 am to provider [facility] , who verbally acknowledged these results. CT CERVICAL SPINE FINDINGS Alignment: Normal Skull base and vertebrae: Negative for fracture or bone lesion Soft tissues and spinal canal: No prevertebral fluid or swelling. No visible canal hematoma.Enlarged thyroid with nodule on the left that is soft tissue density and a low-density 3.7 cm nodule in the right that is partially covered. Disc levels:  No significant degenerative changes Upper chest: Negative IMPRESSION: 1. Subarachnoid hemorrhage in the basal cisterns, recommend CTA to evaluate for aneurysmal source. 2. Mild prominence of the temporal horns, recommend follow-up. 3. Negative cervical spine. 4. Nodular thyroid. Recommend thyroid US (ref: J Am Coll Radiol. 2015 Feb;12(2): 143-50). Electronically Signed   By: Marnee Spring M.D.   On: 09/05/2020 09:30   CT Cervical Spine Wo  Contrast  Result Date: 09/05/2020 CLINICAL DATA:  New or worsening headache, positional. Symptoms began last night suddenly EXAM: CT HEAD WITHOUT CONTRAST CT CERVICAL SPINE WITHOUT CONTRAST TECHNIQUE: Multidetector CT imaging of the head and cervical spine was performed following the standard protocol without intravenous contrast. Multiplanar CT image reconstructions of the cervical spine were also generated. COMPARISON:  None. FINDINGS: CT HEAD FINDINGS Brain: High-density in the basal cisterns, attributed to subarachnoid hemorrhage in the setting of sudden onset headache. The distribution is symmetric. Borderline ventriculomegaly at the temporal horns, but no clear obstructive hydrocephalus. Negative for infarct, mass, or atrophy. 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 Vascular: No focal vessel hyperdensity Skull: Negative Sinuses/Orbits: Negative Other: Critical Value/emergent results were called by telephone at the time of interpretation on 09/05/2020 at 9:27 am to provider [facility] , who verbally acknowledged these results. CT CERVICAL SPINE FINDINGS Alignment: Normal Skull base and vertebrae: Negative for fracture or bone lesion Soft tissues and spinal canal: No prevertebral fluid or swelling. No visible canal hematoma.Enlarged thyroid with nodule on the left that is soft tissue density and a low-density 3.7 cm nodule in the right that is partially covered. Disc levels:  No significant degenerative changes Upper chest: Negative IMPRESSION: 1. Subarachnoid hemorrhage in the basal cisterns, recommend CTA to evaluate for aneurysmal source. 2. Mild prominence of the temporal horns, recommend follow-up. 3. Negative cervical spine. 4. Nodular thyroid. Recommend thyroid US (ref: J Am Coll Radiol. 2015 Feb;12(2): 143-50). Electronically Signed   By: Marnee Spring M.D.   On: 09/05/2020 09:30    ____________________________________________   PROCEDURES  Procedure(s) performed  (including Critical Care):  Procedures   ____________________________________________   INITIAL IMPRESSION / ASSESSMENT AND PLAN / ED COURSE  As part of my medical decision making, I reviewed the following data within the electronic MEDICAL RECORD NUMBER Notes from prior ED visits and [place] Controlled Substance Database  45 year old female presents to the ED with sudden onset of right-sided headache that generalized while eating dinner last night.  Headache was unrelieved by over-the-counter medications.  Patient with light sensitivity and nausea.  No history of trauma to her head or previous headaches.  CT head with history suggest a subarachnoid hemorrhage.  Urinalysis was ordered secondary to gross hematuria noted during POC pregnancy test in the ED.  Urinalysis with RBCs and WBCs greater than 50,000 with rare bacteria.  Radiologist called on findings noted on CT and requested a CT angio to further evaluate the subarachnoid.  Arrangements were made for patient to go to room 15 and Dr. Vicente Males is aware of patient coming to that room  and findings thus far.  ____________________________________________   FINAL CLINICAL IMPRESSION(S) / ED DIAGNOSES  Final diagnoses:  Subarachnoid hemorrhage (HCC)  Urinary tract infection with hematuria, site unspecified     ED Discharge Orders    None      *Please note:  Lori Pierce was evaluated in Emergency Department on 09/05/2020 for the symptoms described in the history of present illness. She was evaluated in the context of the global COVID-19 pandemic, which necessitated consideration that the patient might be at risk for infection with the SARS-CoV-2 virus that causes COVID-19. Institutional protocols and algorithms that pertain to the evaluation of patients at risk for COVID-19 are in a state of rapid change based on information released by regulatory bodies including the CDC and federal and state organizations. These policies and algorithms were  followed during the patient's care in the ED.  Some ED evaluations and interventions may be delayed as a result of limited staffing during and the pandemic.*   Note:  This document was prepared using Dragon voice recognition software and may include unintentional dictation errors.    Tommi Rumps, PA-C 09/05/20 1031    Phineas Semen, MD 09/05/20 1332

## 2020-09-06 ENCOUNTER — Inpatient Hospital Stay (HOSPITAL_COMMUNITY): Payer: No Typology Code available for payment source | Admitting: Certified Registered"

## 2020-09-06 ENCOUNTER — Other Ambulatory Visit (HOSPITAL_COMMUNITY): Payer: Self-pay

## 2020-09-06 ENCOUNTER — Encounter (HOSPITAL_COMMUNITY)
Admission: AD | Discharge status: Against Medical Advice | Payer: Self-pay | Source: Other Acute Inpatient Hospital | Attending: Neurosurgery

## 2020-09-06 ENCOUNTER — Inpatient Hospital Stay (HOSPITAL_COMMUNITY): Payer: No Typology Code available for payment source

## 2020-09-06 HISTORY — PX: RADIOLOGY WITH ANESTHESIA: SHX6223

## 2020-09-06 HISTORY — PX: IR ANGIO VERTEBRAL SEL VERTEBRAL UNI R MOD SED: IMG5368

## 2020-09-06 HISTORY — PX: IR ANGIOGRAM FOLLOW UP STUDY: IMG697

## 2020-09-06 HISTORY — PX: IR ANGIO VERTEBRAL SEL SUBCLAVIAN INNOMINATE UNI L MOD SED: IMG5364

## 2020-09-06 HISTORY — PX: IR TRANSCATH/EMBOLIZ: IMG695

## 2020-09-06 LAB — PREPARE RBC (CROSSMATCH)

## 2020-09-06 LAB — ABO/RH: ABO/RH(D): A POS

## 2020-09-06 SURGERY — IR WITH ANESTHESIA
Anesthesia: General

## 2020-09-06 MED ORDER — FENTANYL CITRATE (PF) 250 MCG/5ML IJ SOLN
INTRAMUSCULAR | Status: DC | PRN
  Administered 2020-09-06: 100 ug via INTRAVENOUS

## 2020-09-06 MED ORDER — ROCURONIUM BROMIDE 100 MG/10ML IV SOLN
INTRAVENOUS | Status: DC | PRN
  Administered 2020-09-06: 60 mg via INTRAVENOUS

## 2020-09-06 MED ORDER — LACTATED RINGERS IV SOLN: INTRAVENOUS | Status: DC | PRN

## 2020-09-06 MED ORDER — CHLORHEXIDINE GLUCONATE 0.12 % MT SOLN
OROMUCOSAL | Status: AC
  Administered 2020-09-06: 15 mL
  Filled 2020-09-06: qty 15

## 2020-09-06 MED ORDER — CHLORHEXIDINE GLUCONATE CLOTH 2 % EX PADS
6.0000 | MEDICATED_PAD | Freq: Every day | CUTANEOUS | Status: DC
  Administered 2020-09-06 – 2020-09-19 (×13): 6 via TOPICAL

## 2020-09-06 MED ORDER — PHENYLEPHRINE HCL-NACL 10-0.9 MG/250ML-% IV SOLN
INTRAVENOUS | Status: DC | PRN
  Administered 2020-09-06: 25 ug/min via INTRAVENOUS

## 2020-09-06 MED ORDER — SODIUM CHLORIDE 0.9% IV SOLUTION: Freq: Once | INTRAVENOUS | Status: DC

## 2020-09-06 MED ORDER — IOHEXOL 300 MG/ML  SOLN: 150.0000 mL | Freq: Once | INTRAMUSCULAR | Status: DC | PRN

## 2020-09-06 MED ORDER — DEXAMETHASONE SODIUM PHOSPHATE 10 MG/ML IJ SOLN
INTRAMUSCULAR | Status: DC | PRN
  Administered 2020-09-06: 10 mg via INTRAVENOUS

## 2020-09-06 MED ORDER — IOHEXOL 300 MG/ML  SOLN
150.0000 mL | Freq: Once | INTRAMUSCULAR | Status: AC | PRN
  Administered 2020-09-06: 125 mL via INTRA_ARTERIAL

## 2020-09-06 MED ORDER — LIDOCAINE 2% (20 MG/ML) 5 ML SYRINGE
INTRAMUSCULAR | Status: DC | PRN
  Administered 2020-09-06: 60 mg via INTRAVENOUS

## 2020-09-06 MED ORDER — ONDANSETRON HCL 4 MG/2ML IJ SOLN
INTRAMUSCULAR | Status: DC | PRN
  Administered 2020-09-06: 4 mg via INTRAVENOUS

## 2020-09-06 MED ORDER — PROPOFOL 10 MG/ML IV BOLUS
INTRAVENOUS | Status: DC | PRN
Start: 2020-09-06 — End: 2020-09-06
  Administered 2020-09-06: 130 mg via INTRAVENOUS

## 2020-09-06 MED ORDER — POTASSIUM CHLORIDE 10 MEQ/100ML IV SOLN
10.0000 meq | INTRAVENOUS | Status: AC
  Administered 2020-09-06 (×3): 10 meq via INTRAVENOUS
  Filled 2020-09-06 (×4): qty 100

## 2020-09-06 MED ORDER — FENTANYL CITRATE (PF) 100 MCG/2ML IJ SOLN
INTRAMUSCULAR | Status: AC
  Filled 2020-09-06: qty 2

## 2020-09-06 MED ORDER — SUGAMMADEX SODIUM 200 MG/2ML IV SOLN
INTRAVENOUS | Status: DC | PRN
  Administered 2020-09-06 (×2): 200 mg via INTRAVENOUS

## 2020-09-06 NOTE — Brief Op Note (Signed)
  NEUROSURGERY BRIEF OPERATIVE  NOTE   PREOP DX: Subarachnoid Hemorrhage  POSTOP DX: Same  PROCEDURE: Diagnostic cerebral angiogram  SURGEON: Dr. Lisbeth Renshaw, MD  ANESTHESIA: GETA  EBL: Minimal  SPECIMENS: None  COMPLICATIONS: None  CONDITION: Stable to recovery  FINDINGS (Full report in [facility]): 1. 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 2. No other aneurysms, AVM, or fistulas seen.  3. No vasospasm is seen.   Lisbeth Renshaw, MD [facility]

## 2020-09-06 NOTE — Sedation Documentation (Addendum)
Pt transported to PACU bay 9 via stretcher accompanied by RN and CRNA. Handed off to PACU RN. Right groin site level 0, pulses palpable on bilateral lower extremities. No hematoma present.

## 2020-09-06 NOTE — Anesthesia Procedure Notes (Signed)
Arterial Line Insertion Start/End3/12/2020 12:05 PM Performed by: Audie Pinto, CRNA  Patient location: Pre-op. Preanesthetic checklist: patient identified, risks and benefits discussed and pre-op evaluation Lidocaine 1% used for infiltration Right, radial was placed Catheter size: 20 G Hand hygiene performed  and maximum sterile barriers used  Allen's test indicative of satisfactory collateral circulation Attempts: 2 Procedure performed without using ultrasound guided technique. Following insertion, dressing applied and Biopatch. Patient tolerated the procedure well with no immediate complications.

## 2020-09-06 NOTE — Anesthesia Postprocedure Evaluation (Signed)
Anesthesia Post Note  Patient: Lori Pierce  Procedure(s) Performed: IR WITH ANESTHESIA (N/A )     Patient location during evaluation: PACU Anesthesia Type: General Level of consciousness: awake and alert Pain management: pain level controlled Vital Signs Assessment: post-procedure vital signs reviewed and stable Respiratory status: spontaneous breathing, nonlabored ventilation and respiratory function stable Cardiovascular status: blood pressure returned to baseline and stable Postop Assessment: no apparent nausea or vomiting Anesthetic complications: no   No complications documented.  Last Vitals:  Vitals:   09/06/20 1441 09/06/20 1456  BP: 117/75 117/84  Pulse: 71 70  Resp: 11 12  Temp:  36.7 C  SpO2: 94% 94%    Last Pain:  Vitals:   09/06/20 1456  TempSrc:   PainSc: Asleep                 Evon Lopezperez,W. EDMOND

## 2020-09-06 NOTE — Progress Notes (Signed)
PT Cancellation Note  Patient Details Name: Lori Pierce MRN: [id number] DOB: 1975/11/02   Cancelled Treatment:      Pt was at procedure earlier and now on bedrest.  Will f/u at later date. Anise Salvo, PT Acute Rehab Services Pager [phone] [facility] [phone]    Rayetta Humphrey 09/06/2020, 5:04 PM

## 2020-09-06 NOTE — Sedation Documentation (Signed)
7 Fr. exoseal closure device deployed

## 2020-09-06 NOTE — Anesthesia Preprocedure Evaluation (Addendum)
Anesthesia Evaluation  Patient identified by MRN, date of birth, ID band Patient awake    Reviewed: Allergy & Precautions, H&P , NPO status , Patient's Chart, lab work & pertinent test results  Airway Mallampati: II  TM Distance: >3 FB Neck ROM: Full    Dental no notable dental hx. (+) Teeth Intact, Dental Advisory Given   Pulmonary asthma , Current Smoker and Patient abstained from smoking.,    Pulmonary exam normal breath sounds clear to auscultation       Cardiovascular negative cardio ROS   Rhythm:Regular Rate:Normal     Neuro/Psych negative psych ROS   GI/Hepatic negative GI ROS, Neg liver ROS,   Endo/Other  negative endocrine ROS  Renal/GU negative Renal ROS  negative genitourinary   Musculoskeletal   Abdominal   Peds  Hematology  (+) Blood dyscrasia, anemia ,   Anesthesia Other Findings   Reproductive/Obstetrics negative OB ROS                            Anesthesia Physical Anesthesia Plan  ASA: III  Anesthesia Plan: General   Post-op Pain Management:    Induction: Intravenous  PONV Risk Score and Plan: 3 and Ondansetron, Dexamethasone and Midazolam  Airway Management Planned: Oral ETT  Additional Equipment: Arterial line  Intra-op Plan:   Post-operative Plan: Extubation in OR and Possible Post-op intubation/ventilation  Informed Consent: I have reviewed the patients History and Physical, chart, labs and discussed the procedure including the risks, benefits and alternatives for the proposed anesthesia with the patient or authorized representative who has indicated his/her understanding and acceptance.     Dental advisory given  Plan Discussed with: CRNA  Anesthesia Plan Comments:         Anesthesia Quick Evaluation

## 2020-09-06 NOTE — Sedation Documentation (Signed)
Procedure started. Anesthesia case 

## 2020-09-06 NOTE — Anesthesia Procedure Notes (Signed)
Procedure Name: Intubation Date/Time: 09/06/2020 12:40 PM Performed by: Lynnell Chad, CRNA Pre-anesthesia Checklist: Patient identified, Emergency Drugs available, Suction available and Patient being monitored Patient Re-evaluated:Patient Re-evaluated prior to induction Oxygen Delivery Method: Circle System Utilized Preoxygenation: Pre-oxygenation with 100% oxygen Induction Type: IV induction Ventilation: Mask ventilation without difficulty Laryngoscope Size: Miller and 2 Grade View: Grade I Tube type: Oral Tube size: 7.0 mm Number of attempts: 1 Airway Equipment and Method: Stylet and Oral airway Placement Confirmation: ETT inserted through vocal cords under direct vision,  positive ETCO2 and breath sounds checked- equal and bilateral Secured at: 21 cm Tube secured with: Tape Dental Injury: Teeth and Oropharynx as per pre-operative assessment

## 2020-09-06 NOTE — Progress Notes (Signed)
  NEUROSURGERY PROGRESS NOTE   No issues overnight. History reviewed in EMR and with pt. Presented with [facility] starting yesterday while eating. Associated nausea but no other c/o double vision, N/T/W. Headache somewhat improved this am. No significant PMHx, 20 pk-yr history of smoking, no known FHx of intracranial aneurysm or hemorrhage.  EXAM:  BP 103/74   Pulse 61   Temp 98.8 F (37.1 C) (Oral)   Resp 15   Ht 5\' 4"  (1.626 m)   Wt 54.5 kg   SpO2 97%   BMI 20.62 kg/m   Awake, alert, oriented  Speech fluent, appropriate  CN grossly intact  5/5 BUE/BLE   IMAGING: CT demonstrates diffuse basal SAH without ventricular extension. No HCP.  CTA demonstrates a posteriorly projecting aneurysm of the supraclinoid RICA, ?smaller more proximal aneurysm vs infundibulum.  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  PLAN: - Will proceed with angiogram, appropriate treatment of RICA aneurysm  I reviewed with the patient the imaging findings thus far. We discussed the need for treatment and the possible treatment options for intracranial aneurysms including endovascular coiling and open clip ligation. The risks of the angiogram, coiling, and surgical clipping were also reviewed to include stroke and aneurysm re-rupture leading to weakness/paralysis/coma/death (up to 5-10%), infection, SZ, hydrocephalus. We also discussed the potential for delayed stroke due to vasospasm postoperatively.  The patient appeared to understand our discussion and she provided consent to proceed with diagnostic angiogram and the appropriate treatment (coiling vs. clipping) for any identified aneurysm.   45, MD [facility]

## 2020-09-06 NOTE — Progress Notes (Signed)
OT Cancellation Note  Patient Details Name: Lori Pierce MRN: [id number] DOB: 10/17/1975   Cancelled Treatment:    Reason Eval/Treat Not Completed: Patient at procedure or test/ unavailable (Off the floor for sx. Will return as schedule allows.)  Jden Want M Damean Poffenberger Neeta Storey MSOT, OTR/L Acute Rehab Pager: [phone] Office: [phone] 09/06/2020, 11:10 AM

## 2020-09-06 NOTE — Transfer of Care (Signed)
Immediate Anesthesia Transfer of Care Note  Patient: Lori Pierce  Procedure(s) Performed: IR WITH ANESTHESIA (N/A )  Patient Location: PACU  Anesthesia Type:General  Level of Consciousness: drowsy and patient cooperative  Airway & Oxygen Therapy: Patient Spontanous Breathing and Patient connected to face mask oxygen  Post-op Assessment: Report given to RN and Post -op Vital signs reviewed and stable  Post vital signs: Reviewed and stable  Last Vitals:  Vitals Value Taken Time  BP 122/76 09/06/20 1426  Temp    Pulse 81 09/06/20 1427  Resp 14 09/06/20 1427  SpO2 97 % 09/06/20 1427  Vitals shown include unvalidated device data.  Last Pain:  Vitals:   09/06/20 0906  TempSrc:   PainSc: Asleep         Complications: No complications documented.

## 2020-09-07 ENCOUNTER — Encounter (HOSPITAL_COMMUNITY): Payer: Self-pay | Admitting: Neurosurgery

## 2020-09-07 ENCOUNTER — Inpatient Hospital Stay (HOSPITAL_COMMUNITY): Payer: No Typology Code available for payment source

## 2020-09-07 DIAGNOSIS — I609 Nontraumatic subarachnoid hemorrhage, unspecified: Secondary | ICD-10-CM

## 2020-09-07 MED ORDER — LEVETIRACETAM 500 MG PO TABS
500.0000 mg | ORAL_TABLET | Freq: Two times a day (BID) | ORAL | Status: DC
  Administered 2020-09-07 – 2020-09-11 (×9): 500 mg via ORAL
  Filled 2020-09-07 (×9): qty 1

## 2020-09-07 NOTE — Evaluation (Signed)
Occupational Therapy Evaluation Patient Details Name: Lori Pierce MRN: [id number] DOB: [birth year] Today's Date: 09/07/2020    History of Present Illness Pt is a 45 y.o. F admitted 3/6 with WHOL. 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 Significant PMH: asthma, allergies.   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 Pt currently requiring Min Guard A - Min A for ADLs and functional mobility. Pt presenting with decreased balance and cognition impacting functional performance. Pt would benefit from further acute OT to further assess cognition as pt is eager to return to PLOF, work, and driving. Recommend dc to home with follow up at OP OT for optimizing safety and independence.     Follow Up Recommendations  Outpatient OT;Supervision - Intermittent    Equipment Recommendations  Tub/shower seat    Recommendations for Other Services Speech consult     Precautions / Restrictions Precautions Precautions: Fall      Mobility Bed Mobility Overal bed mobility: Independent             General bed mobility comments: HOB flat    Transfers Overall transfer level: Needs assistance Equipment used: None Transfers: Sit to/from Stand Sit to Stand: Min guard         General transfer comment: Min guard to rise from edge of bed    Balance Overall balance assessment: Needs assistance Sitting-balance support: Feet supported Sitting balance-Leahy Scale: Good     Standing balance support: No upper extremity supported;During functional activity Standing balance-Leahy Scale: Fair                             ADL either performed or assessed with clinical judgement   ADL Overall ADL's : Needs assistance/impaired Eating/Feeding: Supervision/ safety;Set up;Sitting   Grooming: Oral care;Minimal  assistance;Standing   Upper Body Bathing: Supervision/ safety;Set up;Sitting   Lower Body Bathing: Minimal assistance;Sit to/from stand   Upper Body Dressing : Supervision/safety;Set up;Sitting   Lower Body Dressing: Minimal assistance;Sit to/from stand   Toilet Transfer: Min guard;Minimal assistance;Ambulation (simulated to recliner)           Functional mobility during ADLs: Minimal assistance General ADL Comments: Pt presenting with decreased balance, strength, and cognition.     Vision Baseline Vision/History: Wears glasses (contacts) Wears Glasses: At all times Patient Visual Report: No change from baseline Vision Assessment?: No apparent visual deficits     Perception     Praxis      Pertinent Vitals/Pain Pain Assessment: 0-10 Faces Pain Scale: Hurts a little bit Pain Location: headache Pain Descriptors / Indicators: Headache Pain Intervention(s): Limited activity within patient's tolerance;Monitored during session;Repositioned     Hand Dominance Right   Extremity/Trunk Assessment Upper Extremity Assessment Upper Extremity Assessment: Overall WFL for tasks assessed (Decreased coordination; WFL strength)   Lower Extremity Assessment Lower Extremity Assessment: Defer to PT evaluation RLE Deficits / Details: Strength 5/5 LLE Deficits / Details: Strength 5/5   Cervical / Trunk Assessment Cervical / Trunk Assessment: Normal   Communication Communication Communication: No difficulties   Cognition Arousal/Alertness: Awake/alert Behavior During Therapy: WFL for tasks assessed/performed Overall Cognitive Status: Impaired/Different from baseline Area of Impairment: Safety/judgement;Awareness                         Safety/Judgement: Decreased awareness of  deficits Awareness: Emergent   General Comments: Pt A&Ox4, able to follow all commands and perform basic mobility tasks. Mild paranoid behaviors i.e. clutching washcloth and refusing to let go.  Also decreased insight into deficits. Unsure of baseline. Pt also requiring increased time for processing. Feel pt may be having executive functioning differenties and will need to further assess   General Comments  VSS    Exercises     Shoulder Instructions      Home Living Family/patient expects to be discharged to:: Private residence Living Arrangements: Alone Available Help at Discharge: Family;Available PRN/intermittently Type of Home: House Home Access: Stairs to enter [employer] of Steps: 8 Entrance Stairs-Rails: Right;Left Home Layout: One level     Bathroom Shower/Tub: Chief Strategy Officer: Standard     Home Equipment: None          Prior Functioning/Environment Level of Independence: Independent        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        OT Problem List: Decreased strength;Decreased activity tolerance;Decreased range of motion;Impaired balance (sitting and/or standing);Decreased knowledge of use of DME or AE;Decreased knowledge of precautions;Decreased safety awareness;Decreased cognition;Pain      OT Treatment/Interventions: Self-care/ADL training;Therapeutic exercise;Energy conservation;DME and/or AE instruction;Therapeutic activities;Patient/family education    OT Goals(Current goals can be found in the care plan section) Acute Rehab OT Goals Patient Stated Goal: did not state OT Goal Formulation: With patient Time For Goal Achievement: 09/21/20 Potential to Achieve Goals: Good  OT Frequency: Min 2X/week   Barriers to D/C:            Co-evaluation PT/OT/SLP Co-Evaluation/Treatment: Yes Reason for Co-Treatment: For patient/therapist safety;To address functional/ADL transfers   OT goals addressed during session: ADL's and self-care      AM-PAC OT "6 Clicks" Daily Activity     Outcome Measure Help from another person eating meals?: A Little Help from another person taking care of personal grooming?: A  Little Help from another person toileting, which includes using toliet, bedpan, or urinal?: A Little Help from another person bathing (including washing, rinsing, drying)?: A Little Help from another person to put on and taking off regular upper body clothing?: A Little Help from another person to put on and taking off regular lower body clothing?: A Little 6 Click Score: 18   End of Session Equipment Utilized During Treatment: Gait belt Nurse Communication: Mobility status  Activity Tolerance: Patient tolerated treatment well Patient left: in chair;with call bell/phone within reach;with chair alarm set  OT Visit Diagnosis: Unsteadiness on feet (R26.81);Other abnormalities of gait and mobility (R26.89);Muscle weakness (generalized) (M62.81);Pain Pain - part of body:  (Neck)                Time: 9163-8466 OT Time Calculation (min): 25 min Charges:  OT General Charges $OT Visit: 1 Visit OT Evaluation $OT Eval Moderate Complexity: 1 Mod  AmeLie Hollars MSOT, OTR/L Acute Rehab Pager: [phone] Office: [phone]  Theodoro Grist Lorilynn Lehr 09/07/2020, 3:34 PM

## 2020-09-07 NOTE — Progress Notes (Signed)
  NEUROSURGERY PROGRESS NOTE   No issues overnight. Pt with some HA, otherwise well  EXAM:  BP 128/86   Pulse (!) 58   Temp 98.8 F (37.1 C) (Oral)   Resp (!) 9   Ht 5\' 4"  (1.626 m)   Wt 54.5 kg   LMP 09/03/2020   SpO2 99%   BMI 20.62 kg/m   Awake, alert, oriented  Speech fluent, appropriate  CN grossly intact  5/5 BUE/BLE   IMPRESSION:  45 y.o. female SAH d# 2 s/p coiling RPcom aneurysm, neurologically well  PLAN: - Cont observation for spasm - Cont Nimotop - TCD tomorrow - PT/OT   45, MD [facility]

## 2020-09-07 NOTE — Evaluation (Signed)
Physical Therapy Evaluation Patient Details Name: Lori Pierce MRN: [id number] DOB: 1976-05-19 Today's Date: 09/07/2020   History of Present Illness  Pt is a 45 y.o. F admitted 3/6 with WHOL. 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 Significant PMH: asthma, allergies.  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 Pt presents with decreased functional mobility secondary to dynamic balance impairments and gait abnormalities. Pt ambulating x 250 feet with no assistive device, progressing to a min guard assist level. Suspect pt will make good progress based on age, PLOF, and motivation. Recommending OPPT at follow up to address deficits and maximize functional independence.     Follow Up Recommendations Outpatient PT;Supervision for mobility/OOB    Equipment Recommendations  None recommended by PT    Recommendations for Other Services       Precautions / Restrictions Precautions Precautions: Fall Restrictions Weight Bearing Restrictions: No      Mobility  Bed Mobility Overal bed mobility: Independent             General bed mobility comments: HOB flat    Transfers Overall transfer level: Needs assistance Equipment used: None Transfers: Sit to/from Stand Sit to Stand: Min guard         General transfer comment: Min guard to rise from edge of bed  Ambulation/Gait Ambulation/Gait assistance: Min guard;Min assist Gait Distance (Feet): 250 Feet Assistive device: None Gait Pattern/deviations: Step-through pattern;Decreased stride length;Narrow base of support;Decreased dorsiflexion - right;Decreased dorsiflexion - left Gait velocity: decreased   General Gait Details: Pt with decreased bilateral foot clearance, narrow BOS, dynamic instability. Progressing from min assist to min guard assist level with  further distance. No overt LOB  Stairs            Wheelchair Mobility    Modified Rankin (Stroke Patients Only)       Balance Overall balance assessment: Needs assistance Sitting-balance support: Feet supported Sitting balance-Leahy Scale: Good     Standing balance support: No upper extremity supported;During functional activity Standing balance-Leahy Scale: Fair                               Pertinent Vitals/Pain Pain Assessment: 0-10 Pain Score: 4  Faces Pain Scale: No hurt Pain Location: headache Pain Descriptors / Indicators: Headache Pain Intervention(s): Monitored during session    Home Living Family/patient expects to be discharged to:: Private residence Living Arrangements: Alone Available Help at Discharge: Family;Available PRN/intermittently Type of Home: House Home Access: Stairs to enter Entrance Stairs-Rails: Doctor, general practice of Steps: 8 Home Layout: One level Home Equipment: None      Prior Function Level of Independence: Independent         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     Hand Dominance   Dominant Hand: Right    Extremity/Trunk Assessment   Upper Extremity Assessment Upper Extremity Assessment: Defer to OT evaluation    Lower Extremity Assessment Lower Extremity Assessment: RLE deficits/detail;LLE deficits/detail RLE Deficits / Details: Strength 5/5 LLE Deficits / Details: Strength 5/5    Cervical / Trunk Assessment Cervical / Trunk Assessment: Normal  Communication   Communication: No difficulties  Cognition Arousal/Alertness: Awake/alert Behavior During Therapy: WFL for tasks assessed/performed Overall Cognitive Status: Impaired/Different from baseline Area of Impairment: Safety/judgement;Awareness  Safety/Judgement: Decreased awareness of deficits Awareness: Emergent   General Comments: Pt A&Ox4, able to follow all commands and perform basic  mobility tasks. Mild paranoid behaviors i.e. clutching washcloth and refusing to let go. Also decreased insight into deficits. Unsure of baseline.      General Comments  BP supine: 117/74, sitting: 114/85    Exercises     Assessment/Plan    PT Assessment Patient needs continued PT services  PT Problem List Decreased strength;Decreased activity tolerance;Decreased balance;Decreased mobility       PT Treatment Interventions Gait training;Stair training;Functional mobility training;Therapeutic activities;Therapeutic exercise;Balance training;Patient/family education    PT Goals (Current goals can be found in the Care Plan section)  Acute Rehab PT Goals Patient Stated Goal: did not state PT Goal Formulation: With patient Time For Goal Achievement: 09/21/20 Potential to Achieve Goals: Good    Frequency Min 4X/week   Barriers to discharge        Co-evaluation PT/OT/SLP Co-Evaluation/Treatment: Yes Reason for Co-Treatment: For patient/therapist safety;To address functional/ADL transfers PT goals addressed during session: Mobility/safety with mobility         AM-PAC PT "6 Clicks" Mobility  Outcome Measure Help needed turning from your back to your side while in a flat bed without using bedrails?: None Help needed moving from lying on your back to sitting on the side of a flat bed without using bedrails?: None Help needed moving to and from a bed to a chair (including a wheelchair)?: A Little Help needed standing up from a chair using your arms (e.g., wheelchair or bedside chair)?: A Little Help needed to walk in hospital room?: A Little Help needed climbing 3-5 steps with a railing? : A Little 6 Click Score: 20    End of Session Equipment Utilized During Treatment: Gait belt Activity Tolerance: Patient tolerated treatment well Patient left: in chair;with call bell/phone within reach;with chair alarm set Nurse Communication: Mobility status PT Visit Diagnosis:  Unsteadiness on feet (R26.81)    Time: 9563-8756 PT Time Calculation (min) (ACUTE ONLY): 27 min   Charges:   PT Evaluation $PT Eval Moderate Complexity: 1 Mod          Lillia Pauls, PT, DPT Acute Rehabilitation Services Pager [phone] Office [phone]   Norval Morton 09/07/2020, 11:32 AM

## 2020-09-07 NOTE — Evaluation (Signed)
Speech Language Pathology Evaluation Patient Details Name: Lori Pierce MRN: [id number] DOB: 25-Oct-1975 Today's Date: 09/07/2020 Time: 9678-9381 SLP Time Calculation (min) (ACUTE ONLY): 22 min  Problem List:  Patient Active Problem List   Diagnosis Date Noted  . Ruptured cerebral aneurysm (HCC) 09/05/2020   Past Medical History:  Past Medical History:  Diagnosis Date  . Anemia   . Asthma    Past Surgical History:  Past Surgical History:  Procedure Laterality Date  . IR ANGIO VERTEBRAL SEL SUBCLAVIAN INNOMINATE UNI L MOD SED  09/06/2020  . IR ANGIO VERTEBRAL SEL VERTEBRAL UNI R MOD SED  09/06/2020  . IR ANGIOGRAM FOLLOW UP STUDY  09/06/2020  . IR ANGIOGRAM FOLLOW UP STUDY  09/06/2020  . IR ANGIOGRAM FOLLOW UP STUDY  09/06/2020  . IR TRANSCATH/EMBOLIZ  09/06/2020  . RADIOLOGY WITH ANESTHESIA N/A 09/06/2020   Procedure: IR WITH ANESTHESIA;  Surgeon: Lisbeth Renshaw, MD;  Location: [facility] OR;  Service: Radiology;  Laterality: N/A;   HPI:  Lori Pierce is a 45 y.o. female with history of asthma and allergies who presented to the ED with worst headache of life. 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 Underwent successful coil embolization of RCA aneurysm 3/7.   Assessment / Plan / Recommendation Clinical Impression  Pt is oriented and aware of situation but lacks insight into full scope of executive function impairments and memory challenges. She scored a 19 out of possible 30 on SLUMS examination. Pragmatic differences which suspect may be baseline. 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 Will treat while on acute care and recommend follow up ST for evaluation and treatment (if needed).    SLP Assessment  SLP Recommendation/Assessment: Patient needs continued Speech  [place] SLP Visit Diagnosis: Cognitive communication deficit (R41.841)    Follow Up Recommendations  Outpatient SLP    Frequency and Duration min 2x/week  2 weeks      SLP Evaluation Cognition  Overall Cognitive Status: Impaired/Different from baseline Arousal/Alertness: Awake/alert Orientation Level: Oriented X4 Attention: Sustained Sustained Attention: Appears intact Memory: Impaired Memory Impairment: Retrieval deficit Awareness: Impaired Awareness Impairment: Intellectual impairment Problem Solving:  (contin to assess higher level) Executive Function: Decision Making Safety/Judgment: Impaired       Comprehension  Auditory Comprehension Overall Auditory Comprehension: Appears within functional limits for tasks assessed Visual Recognition/Discrimination Discrimination: Not tested Reading Comprehension Reading Status: Not tested    Expression Expression Primary Mode of Expression: Verbal Verbal Expression Overall Verbal Expression: Appears within functional limits for tasks assessed Naming: No impairment Pragmatics: Impairment Impairments: Interpretation of nonverbal communication Written Expression Dominant Hand: Right Written Expression: Not tested   Oral / Motor  Oral Motor/Sensory Function Overall Oral Motor/Sensory Function: Within functional limits Motor Speech Overall Motor Speech: Appears within functional limits for tasks assessed Intelligibility: Intelligible Motor Planning: Witnin functional limits   GO                    Royce Macadamia 09/07/2020, 11:31 AM   Breck Coons Lonell Face.Ed Nurse, children's [phone] Office [phone]

## 2020-09-07 NOTE — Progress Notes (Signed)
Transcranial Doppler  Date POD PCO2 HCT BP  MCA ACA PCA OPHT SIPH VERT Basilar  3/8 MR     Right  Left   102  89   -36  -26   -43  31   30  18    68  41   -30  -27   -43           Right  Left                                            Right  Left                                             Right  Left                                             Right  Left                                            Right  Left                                            Right  Left                                        MCA = Middle Cerebral Artery      OPHT = Opthalmic Artery     BASILAR = Basilar Artery   ACA = Anterior Cerebral Artery     SIPH = Carotid Siphon PCA = Posterior Cerebral Artery   VERT = Verterbral Artery                   Normal MCA = 62+\-12 ACA = 50+\-12 PCA = 42+\-23    09/07/2020 9:18 AM

## 2020-09-08 ENCOUNTER — Inpatient Hospital Stay (HOSPITAL_COMMUNITY): Payer: No Typology Code available for payment source

## 2020-09-08 DIAGNOSIS — I609 Nontraumatic subarachnoid hemorrhage, unspecified: Secondary | ICD-10-CM

## 2020-09-08 NOTE — Progress Notes (Signed)
OT Cancellation Note  Patient Details Name: Lori Pierce MRN: [id number] DOB: 1975/07/16   Cancelled Treatment:    Reason Eval/Treat Not Completed: Pain limiting ability to participate.  Pt admantly refused citing severity of HA 6/10 - max encouragement provided without success.  Will reattempt.  [name]., OTR/L Acute Rehabilitation Services Pager [phone] Office [phone]   [name] M 09/08/2020, 11:17 AM

## 2020-09-08 NOTE — Progress Notes (Addendum)
Transcranial Doppler  Date POD PCO2 HCT BP  MCA ACA PCA OPHT SIPH VERT Basilar  3/8 MR     Right  Left   102  89   -36  -26   -43  31   30  18    68  41   -30  -27   -43      3/9 MR     Right  Left   168  119   -44  -53   49  42   23  18   48  52   -23  -32   -40      3/11 RH     Right  Left   124  96   22  48   36  53   30  28   35  31   39  *         3/14 MR      Right  Left   119  144   -28  *   47  51   25  27   53  97   -32  -31   -41            Right  Left                                            Right  Left                                            Right  Left                                        MCA = Middle Cerebral Artery      OPHT = Opthalmic Artery     BASILAR = Basilar Artery   ACA = Anterior Cerebral Artery     SIPH = Carotid Siphon PCA = Posterior Cerebral Artery   VERT = Verterbral Artery                   Normal MCA = 62+\-12 ACA = 50+\-12 PCA = 42+\-23    4/14 09/13/2020 2:14 PM

## 2020-09-08 NOTE — Progress Notes (Addendum)
  NEUROSURGERY PROGRESS NOTE   No issues overnight. Severe HA this am. Just given morphine and oxy prior to my arrival so a little drowsy  EXAM:  BP 138/88   Pulse (!) 56   Temp 99.2 F (37.3 C) (Oral)   Resp 15   Ht 5\' 4"  (1.626 m)   Wt 54.5 kg   LMP 09/03/2020   SpO2 97%   BMI 20.62 kg/m   Drowsy, easily awakened A&O x4 Speech fluent, appropriate  CN grossly intact  5/5 BUE/BLE   IMPRESSION/PLAN 45 y.o. female SAH d#3 s/p coiling RPcom aneurysm, neurologically well - TCDs today. No clinical sign of vasospasm - Cont Nimotop - PT/OT  Addendum TCDs reviewed. Increase velocity bilateral MCAs R>L. Patient clinically asx. Increase IVF to 125cc/hour. Monitor.

## 2020-09-08 NOTE — Progress Notes (Signed)
Physical Therapy Treatment Patient Details Name: Lori Pierce MRN: [id number] DOB: [birth year] Today's Date: 09/08/2020    History of Present Illness Pt is a 45 y.o. F admitted 3/6 with WHOL. 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 Significant PMH: asthma, allergies.    PT Comments    Limited treatment session due to HA. Pt ambulating to bathroom and back at a supervision level, but deferring further mobility. Balance appears to be improved from yesterday but would like to continue assess and challenge with longer distances and stair training. Will follow up tomorrow.    Follow Up Recommendations  Outpatient PT;Supervision for mobility/OOB     Equipment Recommendations  None recommended by PT    Recommendations for Other Services       Precautions / Restrictions Precautions Precautions: Fall Restrictions Weight Bearing Restrictions: No    Mobility  Bed Mobility Overal bed mobility: Independent                  Transfers Overall transfer level: Needs assistance Equipment used: None Transfers: Sit to/from Stand Sit to Stand: Supervision            Ambulation/Gait Ambulation/Gait assistance: Supervision Gait Distance (Feet): 20 Feet Assistive device: None Gait Pattern/deviations: Step-through pattern;Decreased stride length;Narrow base of support     General Gait Details: Supervision for safety, no overt LOB   Stairs             Wheelchair Mobility    Modified Rankin (Stroke Patients Only) Modified Rankin (Stroke Patients Only) Pre-Morbid Rankin Score: No symptoms Modified Rankin: Moderately severe disability     Balance Overall balance assessment: Needs assistance Sitting-balance support: Feet supported Sitting balance-Leahy Scale: Good     Standing balance support: No upper extremity supported;During  functional activity Standing balance-Leahy Scale: Fair                              Cognition Arousal/Alertness: Awake/alert Behavior During Therapy: WFL for tasks assessed/performed Overall Cognitive Status: Difficult to assess                                 General Comments: Difficult to assess this session as pt minimally interactive with PT and internally distracted by pain      Exercises      General Comments General comments (skin integrity, edema, etc.): vss      Pertinent Vitals/Pain Pain Assessment: Faces Faces Pain Scale: Hurts whole lot Pain Location: headache Pain Descriptors / Indicators: Headache Pain Intervention(s): Limited activity within patient's tolerance;Monitored during session    Home Living                      Prior Function            PT Goals (current goals can now be found in the care plan section) Acute Rehab PT Goals Patient Stated Goal: did not state Potential to Achieve Goals: Good Progress towards PT goals: Progressing toward goals    Frequency    Min 4X/week      PT Plan Current plan remains appropriate    Co-evaluation              AM-PAC PT "6 Clicks" Mobility   Outcome Measure  Help needed turning from your  back to your side while in a flat bed without using bedrails?: None Help needed moving from lying on your back to sitting on the side of a flat bed without using bedrails?: None Help needed moving to and from a bed to a chair (including a wheelchair)?: None Help needed standing up from a chair using your arms (e.g., wheelchair or bedside chair)?: None Help needed to walk in hospital room?: None Help needed climbing 3-5 steps with a railing? : A Little 6 Click Score: 23    End of Session   Activity Tolerance: Patient limited by pain Patient left: in bed;with call bell/phone within reach;with bed alarm set Nurse Communication: Mobility status PT Visit Diagnosis:  Unsteadiness on feet (R26.81)     Time: 1521-1530 PT Time Calculation (min) (ACUTE ONLY): 9 min  Charges:  $Therapeutic Activity: 8-22 mins                     Lillia Pauls, PT, DPT Acute Rehabilitation Services Pager [phone] Office [phone]    Norval Morton 09/08/2020, 5:13 PM

## 2020-09-09 ENCOUNTER — Inpatient Hospital Stay: Payer: Self-pay

## 2020-09-09 DIAGNOSIS — I609 Nontraumatic subarachnoid hemorrhage, unspecified: Secondary | ICD-10-CM

## 2020-09-09 DIAGNOSIS — I607 Nontraumatic subarachnoid hemorrhage from unspecified intracranial artery: Principal | ICD-10-CM

## 2020-09-09 LAB — BPAM RBC
Blood Product Expiration Date: 202204022359
Blood Product Expiration Date: 202204022359
Unit Type and Rh: 6200
Unit Type and Rh: 6200

## 2020-09-09 LAB — TYPE AND SCREEN
ABO/RH(D): A POS
Antibody Screen: NEGATIVE
Unit division: 0
Unit division: 0

## 2020-09-09 LAB — COMPREHENSIVE METABOLIC PANEL
ALT: 13 U/L (ref 0–44)
AST: 16 U/L (ref 15–41)
Albumin: 3 g/dL — ABNORMAL LOW (ref 3.5–5.0)
Alkaline Phosphatase: 29 U/L — ABNORMAL LOW (ref 38–126)
Anion gap: 6 (ref 5–15)
BUN: <5 mg/dL — ABNORMAL LOW (ref 6–20)
CO2: 24 mmol/L (ref 22–32)
Calcium: 8.4 mg/dL — ABNORMAL LOW (ref 8.9–10.3)
Chloride: 103 mmol/L (ref 98–111)
Creatinine, Ser: 0.67 mg/dL (ref 0.44–1.00)
GFR, Estimated: >60 mL/min (ref >60)
Glucose, Bld: 91 mg/dL (ref 70–99)
Potassium: 3.4 mmol/L — ABNORMAL LOW (ref 3.5–5.1)
Sodium: 133 mmol/L — ABNORMAL LOW (ref 135–145)
Total Bilirubin: 0.3 mg/dL (ref 0.3–1.2)
Total Protein: 6 g/dL — ABNORMAL LOW (ref 6.5–8.1)

## 2020-09-09 LAB — CBC
HCT: 30.6 % — ABNORMAL LOW (ref 36.0–46.0)
Hemoglobin: 10.4 g/dL — ABNORMAL LOW (ref 12.0–15.0)
MCH: 28 pg (ref 26.0–34.0)
MCHC: 34 g/dL (ref 30.0–36.0)
MCV: 82.3 fL (ref 80.0–100.0)
Platelets: 571 10*3/uL — ABNORMAL HIGH (ref 150–400)
RBC: 3.72 MIL/uL — ABNORMAL LOW (ref 3.87–5.11)
RDW: 15.1 % (ref 11.5–15.5)
WBC: 7.9 10*3/uL (ref 4.0–10.5)
nRBC: 0 % (ref 0.0–0.2)

## 2020-09-09 LAB — PHOSPHORUS: Phosphorus: 3.1 mg/dL (ref 2.5–4.6)

## 2020-09-09 LAB — MAGNESIUM: Magnesium: 1.6 mg/dL — ABNORMAL LOW (ref 1.7–2.4)

## 2020-09-09 MED ORDER — SODIUM CHLORIDE 0.9% FLUSH
10.0000 mL | Freq: Two times a day (BID) | INTRAVENOUS | Status: DC
  Administered 2020-09-09: 40 mL
  Administered 2020-09-09 – 2020-09-12 (×7): 10 mL
  Administered 2020-09-13: 20 mL
  Administered 2020-09-13 – 2020-09-18 (×9): 10 mL
  Administered 2020-09-18: 20 mL
  Administered 2020-09-19 – 2020-09-20 (×3): 10 mL

## 2020-09-09 MED ORDER — NALOXONE HCL 0.4 MG/ML IJ SOLN: 0.4000 mg | INTRAMUSCULAR | Status: DC | PRN

## 2020-09-09 MED ORDER — PHENYLEPHRINE HCL-NACL 10-0.9 MG/250ML-% IV SOLN
25.0000 ug/min | INTRAVENOUS | Status: DC
  Filled 2020-09-09: qty 250

## 2020-09-09 MED ORDER — NOREPINEPHRINE 16 MG/250ML-% IV SOLN
0.0000 ug/min | INTRAVENOUS | Status: DC
  Administered 2020-09-09: 21:00:00 28 ug/min via INTRAVENOUS
  Administered 2020-09-10: 19:00:00 40 ug/min via INTRAVENOUS
  Administered 2020-09-10: 04:00:00 38 ug/min via INTRAVENOUS
  Administered 2020-09-11 (×3): 40 ug/min via INTRAVENOUS
  Administered 2020-09-12: 34 ug/min via INTRAVENOUS
  Administered 2020-09-12: 26 ug/min via INTRAVENOUS
  Administered 2020-09-12: 27 ug/min via INTRAVENOUS
  Administered 2020-09-13: 38 ug/min via INTRAVENOUS
  Administered 2020-09-13: 35 ug/min via INTRAVENOUS
  Administered 2020-09-13 – 2020-09-15 (×6): 40 ug/min via INTRAVENOUS
  Administered 2020-09-15: 38 ug/min via INTRAVENOUS
  Administered 2020-09-15: 40 ug/min via INTRAVENOUS
  Administered 2020-09-15: 39 ug/min via INTRAVENOUS
  Administered 2020-09-15: 40 ug/min via INTRAVENOUS
  Administered 2020-09-16: 30 ug/min via INTRAVENOUS
  Administered 2020-09-16: 24 ug/min via INTRAVENOUS
  Administered 2020-09-17: 23 ug/min via INTRAVENOUS
  Administered 2020-09-18: 10 ug/min via INTRAVENOUS
  Administered 2020-09-18: 6 ug/min via INTRAVENOUS
  Administered 2020-09-19: 14 ug/min via INTRAVENOUS
  Filled 2020-09-09 (×29): qty 250

## 2020-09-09 MED ORDER — SODIUM CHLORIDE 0.9 % IV SOLN
250.0000 mL | INTRAVENOUS | Status: DC
  Administered 2020-09-10: 250 mL via INTRAVENOUS

## 2020-09-09 MED ORDER — NOREPINEPHRINE 4 MG/250ML-% IV SOLN
0.0000 ug/min | INTRAVENOUS | Status: DC
  Administered 2020-09-09: 2 ug/min via INTRAVENOUS
  Administered 2020-09-09: 18 ug/min via INTRAVENOUS
  Filled 2020-09-09 (×2): qty 250

## 2020-09-09 MED ORDER — NALOXONE HCL 0.4 MG/ML IJ SOLN
INTRAMUSCULAR | Status: AC
  Administered 2020-09-09: 0.4 mg/mL
  Filled 2020-09-09: qty 1

## 2020-09-09 MED ORDER — SODIUM CHLORIDE 0.9% FLUSH: 10.0000 mL | INTRAVENOUS | Status: DC | PRN

## 2020-09-09 MED ORDER — BUTALBITAL-APAP-CAFFEINE 50-325-40 MG PO TABS
1.0000 | ORAL_TABLET | ORAL | Status: DC | PRN
  Administered 2020-09-09 – 2020-09-11 (×10): 2 via ORAL
  Administered 2020-09-12: 1 via ORAL
  Administered 2020-09-12: 2 via ORAL
  Administered 2020-09-12 (×2): 1 via ORAL
  Administered 2020-09-13: 2 via ORAL
  Administered 2020-09-13: 1 via ORAL
  Administered 2020-09-13 (×2): 2 via ORAL
  Administered 2020-09-14 (×3): 1 via ORAL
  Administered 2020-09-14 – 2020-09-19 (×6): 2 via ORAL
  Filled 2020-09-09: qty 1
  Filled 2020-09-09: qty 2
  Filled 2020-09-09 (×3): qty 1
  Filled 2020-09-09 (×4): qty 2
  Filled 2020-09-09: qty 1
  Filled 2020-09-09 (×9): qty 2
  Filled 2020-09-09: qty 1
  Filled 2020-09-09 (×8): qty 2
  Filled 2020-09-09: qty 1
  Filled 2020-09-09: qty 2
  Filled 2020-09-09: qty 1

## 2020-09-09 MED ORDER — PHENYLEPHRINE HCL-NACL 10-0.9 MG/250ML-% IV SOLN: 0.0000 ug/min | INTRAVENOUS | Status: DC

## 2020-09-09 MED ORDER — LACTATED RINGERS IV BOLUS
1000.0000 mL | Freq: Once | INTRAVENOUS | Status: AC
  Administered 2020-09-09: 1000 mL via INTRAVENOUS

## 2020-09-09 NOTE — Consult Note (Signed)
NAMEKealani Pierce, MRN:  [id number], DOB:  1976-06-18, LOS: 4 ADMISSION DATE:  09/05/2020, CONSULTATION DATE:  09/09/2020 REFERRING MD:  Dr. 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 On 3/10 She developed neuros changes including LUE weakness, due to concern for vasospasm PCCM was consulted and HHH therapy was started    History of Present Illness:  Lori Pierce is a 45 y.o. with PMH significant for a asthma who presented to [facility] ED for complaints of the worst headache of her life. The pain was located at her right temple and described as severe and associated with neck pain as well. She tried OTC pain relieves with no change in pain severity. Therefore, she presented to the ED for further workup.   On ED arrival she was seen with mild tachycardia and hypertension. Significant lab works included;  K 3.4 and Hgb 11.0.  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 neurosurgery was consulted and patient was transferred to [facility] for further workup and management. 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   initially she tolerated procedure well but morning of 3/10 she developed some LUE weakness with concerns of developing vasospasms. PCCM was consulted for assistance and further managements.   Past Medical History:  Asthma   Significant Hospital Events:  3/6 Admitted   Consults:    Procedures:  3/10 Cerebral angiogram with successful coil embolization of right posterior communicating artery aneurysm.  Significant Diagnostic Tests:  CT angio head 3/6 > 1. 5 mm right supraclinoid ICA aneurysm, presumably the symptomatic lesion.  2 mm right supraclinoid ICA outpouching which could be infundibulum or aneurysm,  attention on catheter angiography.  IR angiogram 3/7 >1. Successful coil embolization of a right posterior communicating artery aneurysm without local thrombotic or distal embolic complication noted.   Micro Data:  MRSA PCR 3/6 > Negative   Antimicrobials:     Interim History / Subjective:  Seen lying in bed with complaints of continued headache  Objective   Blood pressure (!) 163/95, pulse 68, temperature 99.1 F (37.3 C), temperature source Oral, resp. rate 15, height 5\' 4"  (1.626 m), weight 54.5 kg, last menstrual period 09/03/2020, SpO2 97 %.        Intake/Output Summary (Last 24 hours) at 09/09/2020 0933 Last data filed at 09/09/2020 0800 Gross per 24 hour  Intake 3235.57 ml  Output -  Net 3235.57 ml   Filed Weights   09/06/20 1021  Weight: 54.5 kg    Examination: General: Acute ill appearing elderly adult female lying in mild discomfort but in NAD HEENT: [place]/AT, MM pink/dry, PERRL,  Neuro: Alert and able to follow commands, slight LUE drift, no facial droop, able to follow simple commands  CV: s1s2 regular rate and rhythm, no murmur, rubs, or gallops,  PULM:  Oxygen saturations appropriated on RA, no increased work of breathing   GI: soft, bowel sounds active in all 4 quadrants, non-tender, non-distended Extremities: warm/dry, no edema  Skin: no rashes or lesions  Resolved Hospital Problem list     Assessment & Plan:  SAH secondary to ruptured cerebral aneurysm  -Underwent cerebral angiogram with successful coil embolization of the right posterior communicating artery aneurysm.  -Developed LUE weakness 3/10 with concern for  vasospasm P: Assist with pressor support to facilitation hypertension in the setting of vasospasms Monitor hemodynamics closely  Goal for euvolemia, appears clinically dry will add 1L LR now  Continuous telemetry    PICC line placement  Frequent neuro checks   Best practice (evaluated daily)  Diet: Reg Pain/Anxiety/Delirium protocol  (if indicated): As needed  VAP protocol (if indicated): N/A DVT prophylaxis: SCD GI prophylaxis: PPI Glucose control: Monitor  Mobility: Bedrest  Disposition:ICU   Goals of Care:  Per primary   Labs   CBC: Recent Labs  Lab 09/05/20 0726 09/05/20 1955  WBC 7.9 7.5  NEUTROABS 6.3  --   HGB 11.0* 11.2*  HCT 34.2* 35.0*  MCV 84.0 85.0  PLT 747* 734*    Basic Metabolic Panel: Recent Labs  Lab 09/05/20 0726  NA 136  K 3.4*  CL 103  CO2 22  GLUCOSE 127*  BUN 13  CREATININE 0.79  CALCIUM 9.0   GFR: Estimated Creatinine Clearance: 78.8 mL/min (by C-G formula based on SCr of 0.79 mg/dL). Recent Labs  Lab 09/05/20 0726 09/05/20 1955  WBC 7.9 7.5    Liver Function Tests: Recent Labs  Lab 09/05/20 0726  AST 24  ALT 14  ALKPHOS 35*  BILITOT 0.5  PROT 7.8  ALBUMIN 4.2   No results for input(s): LIPASE, AMYLASE in the last 168 hours. No results for input(s): AMMONIA in the last 168 hours.  ABG No results found for: PHART, PCO2ART, PO2ART, HCO3, TCO2, ACIDBASEDEF, O2SAT   Coagulation Profile: Recent Labs  Lab 09/05/20 1955  INR 1.1    Cardiac Enzymes: No results for input(s): CKTOTAL, CKMB, CKMBINDEX, TROPONINI in the last 168 hours.  HbA1C: No results found for: HGBA1C  CBG: No results for input(s): GLUCAP in the last 168 hours.  Review of Systems:   Gen: Denies fever, chills, weight change, fatigue, night sweats HEENT: Denies blurred vision, double vision, hearing loss, tinnitus, sinus congestion, rhinorrhea, sore throat, neck stiffness, dysphagia PULM: Denies shortness of breath, cough, sputum production, hemoptysis, wheezing CV: Denies chest pain, edema, orthopnea, paroxysmal nocturnal dyspnea, palpitations GI: Denies abdominal pain, nausea, vomiting, diarrhea, hematochezia, melena, constipation, change in bowel habits GU: Denies dysuria, hematuria, polyuria, oliguria, urethral discharge Endocrine: Denies hot or cold intolerance, polyuria,  polyphagia or appetite change Derm: Denies rash, dry skin, scaling or peeling skin change Heme: Denies easy bruising, bleeding, bleeding gums Neuro: Denies headache, numbness, weakness, slurred speech, loss of memory or consciousness  Past Medical History:  She,  has a past medical history of Anemia and Asthma.   Surgical History:   Past Surgical History:  Procedure Laterality Date  . IR ANGIO VERTEBRAL SEL SUBCLAVIAN INNOMINATE UNI L MOD SED  09/06/2020  . IR ANGIO VERTEBRAL SEL VERTEBRAL UNI R MOD SED  09/06/2020  . IR ANGIOGRAM FOLLOW UP STUDY  09/06/2020  . IR ANGIOGRAM FOLLOW UP STUDY  09/06/2020  . IR ANGIOGRAM FOLLOW UP STUDY  09/06/2020  . IR TRANSCATH/EMBOLIZ  09/06/2020  . RADIOLOGY WITH ANESTHESIA N/A 09/06/2020   Procedure: IR WITH ANESTHESIA;  Surgeon: Lisbeth Renshaw, MD;  Location: [facility] OR;  Service: Radiology;  Laterality: N/A;     Social History:   reports that she has been smoking. She has never used smokeless tobacco. She reports current alcohol use. She reports previous drug use.   Family History:  Her family history is not on file.   Allergies No Known Allergies   Home Medications  Prior to Admission medications   Medication Sig  Start Date End Date Taking? Authorizing Provider  acetaminophen (TYLENOL) 500 MG tablet Take 500 mg by mouth every 6 (six) hours as needed for moderate pain.   Yes [provider]  ibuprofen (ADVIL) 200 MG tablet Take 200 mg by mouth every 6 (six) hours as needed for mild pain.   Yes [provider]  mirtazapine (REMERON) 7.5 MG tablet Take 7.5 mg by mouth at bedtime.   Yes [provider]     Critical care time:   Performed by: Delfin Gant   Total critical care time: 38 minutes  Critical care time was exclusive of separately billable procedures and treating other patients.  Critical care was necessary to treat or prevent imminent or life-threatening deterioration.  Critical care was time spent personally  by me on the following activities: development of treatment plan with patient and/or surrogate as well as nursing, discussions with consultants, evaluation of patient's response to treatment, examination of patient, obtaining history from patient or surrogate, ordering and performing treatments and interventions, ordering and review of laboratory studies, ordering and review of radiographic studies, pulse oximetry and re-evaluation of patient's condition.    Delfin Gant, NP-C [facility] Personal contact information can be found on Amion  If no response please page: Adult pulmonary and critical care medicine pager on Amion unitl 7pm After 7pm please call [phone] 09/09/2020, 10:34 AM

## 2020-09-09 NOTE — Progress Notes (Signed)
Pt given oxycodone 10mg  @ 0100 for 7/10 headache.  Still c/o  7/10 headache @ 0130 given 1mg  diluadid.  Approximately 20 minutes later the pt desat into 60s.  Respiratory support via nasal cannula and NRB not sufficient to increase spO2.  Pt awakens, but falls asleep rapidly.  Narcan pulled emergently and administered.  Pt popped back up, eyes wide open, and regained full respiratory drive.  Oxygen saturation 99% on RA.    Dr. , [facility], notified.  Orders given for PRN narcan.  Will continue to monitor and limit narcotic use.

## 2020-09-09 NOTE — Progress Notes (Signed)
Peripherally Inserted Central Catheter Placement  The IV Nurse has discussed with the patient and/or persons authorized to consent for the patient, the purpose of this procedure and the potential benefits and risks involved with this procedure.  The benefits include less needle sticks, lab draws from the catheter, and the patient may be discharged home with the catheter. Risks include, but not limited to, infection, bleeding, blood clot (thrombus formation), and puncture of an artery; nerve damage and irregular heartbeat and possibility to perform a PICC exchange if needed/ordered by physician.  Alternatives to this procedure were also discussed.  Bard Power PICC patient education guide, fact sheet on infection prevention and patient information card has been provided to patient /or left at bedside.    PICC Placement Documentation  PICC Double Lumen 09/09/20 PICC Right Brachial 36 cm 0 cm (Active)  Indication for Insertion or Continuance of Line Vasoactive infusions 09/09/20 1046  Exposed Catheter (cm) 0 cm 09/09/20 1046  Site Assessment Clean;Dry;Intact 09/09/20 1046  Lumen #1 Status Flushed;Saline locked;Blood return noted 09/09/20 1046  Lumen #2 Status Flushed;Saline locked;Blood return noted 09/09/20 1046  Dressing Type Transparent;Securing device 09/09/20 1046  Dressing Status Clean;Dry;Intact 09/09/20 1046  Antimicrobial disc in place? Yes 09/09/20 1046  Dressing Intervention New dressing 09/09/20 1046  Dressing Change Due 09/16/20 09/09/20 1046       Annett Fabian 09/09/2020, 10:47 AM

## 2020-09-09 NOTE — Progress Notes (Addendum)
°  NEUROSURGERY PROGRESS NOTE   Events of last night reviewed. Complains of 7/10 HA today, numbness LUE No N/T/W in RUE, BLE  EXAM:  BP (!) 163/95 (BP Location: Right Arm)    Pulse 68    Temp 99.1 F (37.3 C) (Oral)    Resp 15    Ht 5\' 4"  (1.626 m)    Wt 54.5 kg    LMP 09/03/2020    SpO2 97%    BMI 20.62 kg/m   Awake, alert, oriented  Speech fluent, appropriate  CN grossly intact  5/5 RUE/BLE, 4/5 LUE but gives away easy due to feeling "numb" + drift left  IMPRESSION/PLAN 45 y.o. female SAH d#4 s/p coiling RPcom aneurysm. Elevated RMCA>LMCA velocity, LUE weakness. - HHH therapy. SBP goal > 180. Consulted CCM - continue nimotop, keppra, supportive care - continue PT/OT

## 2020-09-09 NOTE — Progress Notes (Signed)
Physical Therapy Treatment Patient Details Name: Lori Pierce MRN: [id number] DOB: [birth year] Today's Date: 09/09/2020    History of Present Illness Pt is a 45 y.o. F admitted 3/6 with WHOL. 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 Significant PMH: asthma, allergies.    PT Comments    The pt was agreeable to session with focus on progressing ambulation and mobility to include stair navigation. The pt remains able to navigate short distances with supervision for safety, but with longer distance ambulation required repeated cueing to attend to L side and to increase stride length and clearance. The pt is able to make adjustments to cues immediately, but was then unable to maintain for >3-5 seconds at a time. The pt is able to verbalize understanding that this impacts her ability to move safely, but is unable to self-monitor or assess while mobilizing. Will continue to assess with mobility and higher balance challenge/dual task to ensure safety with mobilizing after d/c.    Follow Up Recommendations  Outpatient PT;Supervision for mobility/OOB     Equipment Recommendations  None recommended by PT    Recommendations for Other Services       Precautions / Restrictions Precautions Precautions: Fall Restrictions Weight Bearing Restrictions: No    Mobility  Bed Mobility Overal bed mobility: Independent                  Transfers Overall transfer level: Needs assistance Equipment used: None Transfers: Sit to/from Stand Sit to Stand: Supervision         General transfer comment: supervision for line management  Ambulation/Gait Ambulation/Gait assistance: Supervision;Min assist Gait Distance (Feet): 300 Feet Assistive device: None Gait Pattern/deviations: Step-through pattern;Decreased stride length;Decreased dorsiflexion - right;Decreased  dorsiflexion - left;Shuffle;Narrow base of support Gait velocity: 0.7 m/s Gait velocity interpretation: 1.31 - 2.62 ft/sec, indicative of limited community ambulator General Gait Details: multiple near LOB due to progressive leaning outside BOS with short, shuffling steps. Pt able to improve gait with cues, but unable to maintain cues for >3-5 seconds   Stairs Stairs: Yes Stairs assistance: Min guard;Min assist Stair Management: One rail Right;Alternating pattern;Forwards Number of Stairs: 3 (x4) General stair comments: pt steady with single UE support, but missing placement or clerance of LLE multiple times during session. cues to increase height of steps and to attend to placement of LLE on stair   Wheelchair Mobility    Modified Rankin (Stroke Patients Only) Modified Rankin (Stroke Patients Only) Pre-Morbid Rankin Score: No symptoms Modified Rankin: Moderately severe disability     Balance Overall balance assessment: Needs assistance Sitting-balance support: Feet supported Sitting balance-Leahy Scale: Good     Standing balance support: No upper extremity supported;During functional activity Standing balance-Leahy Scale: Fair                              Cognition Arousal/Alertness: Awake/alert Behavior During Therapy: WFL for tasks assessed/performed Overall Cognitive Status: Impaired/Different from baseline Area of Impairment: Attention;Safety/judgement;Problem solving;Awareness                   Current Attention Level:  (decreased attention to L side without cues, unable to maintain cues for more than 3-5 seconds)     Safety/Judgement: Decreased awareness of deficits Awareness: Intellectual;Emergent Problem Solving: Decreased initiation;Requires verbal cues General Comments: Pt requiring increased cues for safety with mobility, attending to  LUE, and increasing step clearance with gait. Multiple near LOB due to small steps with poor clearance  which pt agrees is dangerous, but then unable to maintain when not directly discussing. Pt able to manage wayfinding around unit with minimal cues      Exercises      General Comments General comments (skin integrity, edema, etc.): may need to monitor BP with gait      Pertinent Vitals/Pain Pain Assessment: Faces Faces Pain Scale: Hurts a little bit Pain Location: headache Pain Descriptors / Indicators: Headache Pain Intervention(s): Monitored during session;Repositioned           PT Goals (current goals can now be found in the care plan section) Acute Rehab PT Goals Patient Stated Goal: Return to home and work PT Goal Formulation: With patient Time For Goal Achievement: 09/21/20 Potential to Achieve Goals: Good Progress towards PT goals: Progressing toward goals    Frequency    Min 4X/week      PT Plan Current plan remains appropriate       AM-PAC PT "6 Clicks" Mobility   Outcome Measure  Help needed turning from your back to your side while in a flat bed without using bedrails?: None Help needed moving from lying on your back to sitting on the side of a flat bed without using bedrails?: None Help needed moving to and from a bed to a chair (including a wheelchair)?: None Help needed standing up from a chair using your arms (e.g., wheelchair or bedside chair)?: None Help needed to walk in hospital room?: A Little Help needed climbing 3-5 steps with a railing? : A Little 6 Click Score: 22    End of Session Equipment Utilized During Treatment: Gait belt Activity Tolerance: Patient tolerated treatment well Patient left: in bed;with call bell/phone within reach Nurse Communication: Mobility status PT Visit Diagnosis: Unsteadiness on feet (R26.81)     Time: 5009-3818 PT Time Calculation (min) (ACUTE ONLY): 12 min  Charges:  $Gait Training: 8-22 mins                     Rolm Baptise, PT, DPT   Acute Rehabilitation Department Pager #: [phone]   Gaetana Michaelis 09/09/2020, 6:21 PM

## 2020-09-09 NOTE — Progress Notes (Addendum)
Occupational Therapy Treatment Patient Details Name: Lori Pierce MRN: [id number] DOB: 1975-08-21 Today's Date: 09/09/2020    History of present illness Pt is a 45 y.o. F admitted 3/6 with WHOL. 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 Significant PMH: asthma, allergies.   OT comments  Pt progressing towards established OT goals. Pt performing toileting, hand hygiene, and functional mobility with close supervision. Also noting poor FM/GM coordination of LUE during ADLs/ IADLs.  Assessed using the Pill Box Test. Pt failed the assessment, demonstrating poor planning, mental flexibility, suboptimal search strategies, concrete thinking and the inability to multitask. Pt had a total of _21_errors, where more than 3 errors is considered a fail.  Errors: One tablet 3x/day (yellow) - _14_ errors (misplacement) One tablet 2x/day with breakfast and dinner (green) - _7_ errors (misplacement)   Number of misplaced movement errors (pills placed in incorrect compartment)- _21_ Number of total errors (sum of omissions; misplacements) - _21_ Total time to complete task (allowed 5 min) - _7.5_ min  Continue to recommend dc to home with family support for IADLs and follow up at neuro OP OT for cognition, LUE coordination, and balance. Will continue to follow acutely as admitted.   Sister present throughout and reporting another dc option is to dc home with her in [place] and then she can take her to OP apts.    Follow Up Recommendations  Outpatient OT;Supervision - Intermittent    Equipment Recommendations  Tub/shower seat    Recommendations for Other Services Speech consult    Precautions / Restrictions Precautions Precautions: Fall       Mobility Bed Mobility Overal bed mobility: Independent                  Transfers Overall transfer level: Needs  assistance Equipment used: None Transfers: Sit to/from Stand Sit to Stand: Supervision         General transfer comment: Close supervision for safety    Balance Overall balance assessment: Needs assistance Sitting-balance support: Feet supported Sitting balance-Leahy Scale: Good     Standing balance support: No upper extremity supported;During functional activity Standing balance-Leahy Scale: Fair                             ADL either performed or assessed with clinical judgement   ADL Overall ADL's : Needs assistance/impaired     Grooming: Wash/dry Clinical cytogeneticist: Supervision/safety;Ambulation;Regular Toilet;Grab bars   Toileting- Clothing Manipulation and Hygiene: Supervision/safety;Sitting/lateral lean       Functional mobility during ADLs: Supervision/safety;Min guard General ADL Comments: Performing toileting and hadn hygiene with close Supervision due to poor balance and safety awareness. Administered Pill Box test for further assessment of IADLs and executive functioning     Vision       Perception     Praxis      Cognition Arousal/Alertness: Awake/alert Behavior During Therapy: WFL for tasks assessed/performed Overall Cognitive Status: Impaired/Different from baseline Area of Impairment: Problem solving;Awareness Licensed conveyancer functioning)                         Safety/Judgement: Decreased awareness of deficits Awareness: Intellectual;Emergent Problem Solving:  (Poor executive functioning) General Comments: Administering Pill Box Test for further assessment of  cognition. Pt scoring 21 errors, which 3 of more total errors indicate a fail. Pt also requriing 7 min and 30 secs to complete task, which normal range is completed in 5 min or less. Before starting test (after instructions) pt stating "now does it matter which week I do first?" Repeated instructions. Pt misplacing  all three "three times a day" in morning and then misplacing "dinner pill" in bedtime slot. Upon review, pt was able to recognize errors with Mod cues. Pt thanksful for information and chance to test cognition. Pt reporting worsened headache after test.        Exercises     Shoulder Instructions       General Comments Sister present throughout session    Pertinent Vitals/ Pain       Pain Assessment: Faces Faces Pain Scale: Hurts whole lot Pain Location: headache Pain Descriptors / Indicators: Headache Pain Intervention(s): Monitored during session;Repositioned;Other (comment) (Increased HA at end of session)  Home Living                                          Prior Functioning/Environment              Frequency  Min 2X/week        Progress Toward Goals  OT Goals(current goals can now be found in the care plan section)  Progress towards OT goals: Progressing toward goals  Acute Rehab OT Goals Patient Stated Goal: Return to home and work OT Goal Formulation: With patient Time For Goal Achievement: 09/21/20 Potential to Achieve Goals: Good ADL Goals Pt Will Perform Grooming: Independently;standing Additional ADL Goal #1: Pt will perform simple IADL task independently Additional ADL Goal #2: Pt will perform four part trail making task independently  Plan Discharge plan remains appropriate    Co-evaluation                 AM-PAC OT "6 Clicks" Daily Activity     Outcome Measure   Help from another person eating meals?: A Little Help from another person taking care of personal grooming?: A Little Help from another person toileting, which includes using toliet, bedpan, or urinal?: A Little Help from another person bathing (including washing, rinsing, drying)?: A Little Help from another person to put on and taking off regular upper body clothing?: A Little Help from another person to put on and taking off regular lower body clothing?: A  Little 6 Click Score: 18    End of Session    OT Visit Diagnosis: Unsteadiness on feet (R26.81);Other abnormalities of gait and mobility (R26.89);Muscle weakness (generalized) (M62.81);Pain Pain - part of body:  (HA)   Activity Tolerance Patient tolerated treatment well;Other (comment) (Limited at end due to headache)   Patient Left in bed;with call bell/phone within reach;with chair alarm set;with family/visitor present   Nurse Communication Mobility status        Time: 0258-5277 OT Time Calculation (min): 32 min  Charges: OT General Charges $OT Visit: 1 Visit OT Treatments $Self Care/Home Management : 8-22 mins $Cognitive Funtion inital: Initial 15 mins  Arkel Cartwright MSOT, OTR/L Acute Rehab Pager: [phone] Office: [phone]   Theodoro Grist Tkeya Stencil 09/09/2020, 4:29 PM

## 2020-09-10 ENCOUNTER — Inpatient Hospital Stay (HOSPITAL_COMMUNITY): Payer: No Typology Code available for payment source

## 2020-09-10 DIAGNOSIS — I609 Nontraumatic subarachnoid hemorrhage, unspecified: Secondary | ICD-10-CM | POA: Diagnosis not present

## 2020-09-10 DIAGNOSIS — E871 Hypo-osmolality and hyponatremia: Secondary | ICD-10-CM

## 2020-09-10 LAB — RAPID URINE DRUG SCREEN, HOSP PERFORMED
Amphetamines: NOT DETECTED
Barbiturates: POSITIVE — AB
Benzodiazepines: NOT DETECTED
Cocaine: POSITIVE — AB
Opiates: NOT DETECTED
Tetrahydrocannabinol: NOT DETECTED

## 2020-09-10 LAB — URINALYSIS, ROUTINE W REFLEX MICROSCOPIC
Bacteria, UA: NONE SEEN
Bilirubin Urine: NEGATIVE
Glucose, UA: NEGATIVE mg/dL
Ketones, ur: NEGATIVE mg/dL
Leukocytes,Ua: NEGATIVE
Nitrite: NEGATIVE
Protein, ur: NEGATIVE mg/dL
Specific Gravity, Urine: 1.01 (ref 1.005–1.030)
pH: 6 (ref 5.0–8.0)

## 2020-09-10 MED ORDER — POTASSIUM CHLORIDE CRYS ER 20 MEQ PO TBCR
40.0000 meq | EXTENDED_RELEASE_TABLET | Freq: Once | ORAL | Status: AC
  Administered 2020-09-10: 40 meq via ORAL
  Filled 2020-09-10: qty 2

## 2020-09-10 MED ORDER — MAGNESIUM SULFATE 4 GM/100ML IV SOLN
4.0000 g | Freq: Once | INTRAVENOUS | Status: AC
  Administered 2020-09-10: 4 g via INTRAVENOUS
  Filled 2020-09-10: qty 100

## 2020-09-10 NOTE — Progress Notes (Signed)
Pt noted to have rescue inhaler from home in bed this morning upon assessment. Pt states she used inhaler overnight because she felt short of breath periodically. Explained that she needs to notify nurse if she feels short of breath and that she is not to take any home medications without MD approval. Verbalized understanding.

## 2020-09-10 NOTE — Progress Notes (Signed)
0645- Patient's BP found to be in the 70s after rechecking. Patient assessed and Levo and fluids noticed to be disconnected from PICC. Line and PICC were cleaned and reattached. Patient presented with low BP, left side paralysis, aphagia, and forced deviation. As blood pressure rose, patient's neuro exam slowly returned to normal. Patient examed with day shift nurse and patient was back to normal. Will continue to monitor.

## 2020-09-10 NOTE — Progress Notes (Signed)
Pt has attempted to get up out of bed several times despite nursing staff requesting pt to use call bell. Pt expresses disappointment in the visitor policy, verbalizing "it's not fair y'all didn't tell me that I cant have more visitors today." This RN explained the visitation policy and offered emotional support as pt also verbalizes "life doesn't just stop just because I have a brain thing I've got stuff going on." Pt refuses to talk to this RN. Assisted pt back into bed and reinforced the importance of using the call bell before attempting to get out of bed, her safety, and her care during her hospital stay. Will continue to monitor.

## 2020-09-10 NOTE — Progress Notes (Signed)
SLP Cancellation Note  Patient Details Name: Lori Pierce MRN: [id number] DOB: 01/04/1976   Cancelled treatment:        Pt sleeping soundly when therapist checked on this afternoon. She had been consistently attempting to get out of bed with nursing today. Will continue efforts.    Royce Macadamia 09/10/2020, 6:06 PM

## 2020-09-10 NOTE — Progress Notes (Signed)
TCD study completed.   Please see CV Proc for preliminary results.   Delenn Ahn, RDMS, RVT  

## 2020-09-10 NOTE — Progress Notes (Signed)
[name], MRN:  [id number], DOB:  11-15-1975, LOS: 5 ADMISSION DATE:  09/05/2020, CONSULTATION DATE:  09/09/2020 REFERRING MD:  Dr. 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 On 3/10 She developed neuros changes including LUE weakness, due to concern for vasospasm PCCM was consulted and HHH therapy was started    History of Present Illness:  Lori Pierce is a 45 y.o. with PMH significant for a asthma who presented to [facility] ED for complaints of the worst headache of her life. The pain was located at her right temple and described as severe and associated with neck pain as well. She tried OTC pain relieves with no change in pain severity. Therefore, she presented to the ED for further workup.   On ED arrival she was seen with mild tachycardia and hypertension. Significant lab works included;  K 3.4 and Hgb 11.0.  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 neurosurgery was consulted and patient was transferred to [facility] for further workup and management. 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   initially she tolerated procedure well but morning of 3/10 she developed some LUE weakness with concerns of developing vasospasms. PCCM was consulted for assistance and further managements.   Past Medical History:  Asthma   Significant Hospital Events:  3/6 Admitted   Consults:    Procedures:  3/10 Cerebral angiogram with successful coil embolization of right posterior communicating artery aneurysm.  Significant Diagnostic Tests:  CT angio head 3/6 > 1. 5 mm right supraclinoid ICA aneurysm, presumably the symptomatic lesion.  2 mm right supraclinoid ICA outpouching which could be infundibulum or aneurysm,  attention on catheter angiography.  IR angiogram 3/7 >1. Successful coil embolization of a right posterior communicating artery aneurysm without local thrombotic or distal embolic complication noted.   Micro Data:  MRSA PCR 3/6 > Negative   Antimicrobials:     Interim History / Subjective:  States she is frustrated today that she is unable to care for herself independently IV line disconnected overnight resulting in hypotension and neuro deficit   Objective   Blood pressure (!) 137/94, pulse 68, temperature 98.6 F (37 C), temperature source Oral, resp. rate (!) 28, height 5\' 4"  (1.626 m), weight 54.5 kg, last menstrual period 09/03/2020, SpO2 100 %.        Intake/Output Summary (Last 24 hours) at 09/10/2020 0917 Last data filed at 09/10/2020 0700 Gross per 24 hour  Intake 3393.29 ml  Output --  Net 3393.29 ml   Filed Weights   09/06/20 1021  Weight: 54.5 kg    Examination: General: well developed adult female sitting up in bedroom attempting to take bath  HEENT: [place]/AT, MM pink/moist, PERRL,  Neuro: alert and oriented x3, strength 5/5 in all extremities but noticeable left upper extremity neglect  CV: s1s2 regular rate and rhythm, no murmur, rubs, or gallops,  PULM:  Clear to ascultation bilaterally, no added breath sounds, no increased work of breathing  GI: soft, bowel sounds active in all 4 quadrants, non-tender, non-distended Extremities: warm/dry, no edema  Skin: no rashes or lesions  Resolved Hospital Problem list     Assessment & Plan:  SAH secondary to ruptured cerebral aneurysm  -Underwent cerebral angiogram with successful  coil embolization of the right posterior communicating artery aneurysm.  -Developed LUE weakness 3/10 with concern for vasospasm P: Continue IV levo for SBP goal greater than 180 Stop IV fluids  Monitor hemodynamic closely in the ICU setting  Goal of euvolemia Continuous telemetry  Frequent neuro checks   Best practice (evaluated  daily)  Diet: Reg Pain/Anxiety/Delirium protocol (if indicated): As needed  VAP protocol (if indicated): N/A DVT prophylaxis: SCD GI prophylaxis: PPI Glucose control: Monitor  Mobility: Bedrest  Disposition:ICU   Goals of Care:  Per primary   Labs   CBC: Recent Labs  Lab 09/05/20 0726 09/05/20 1955 09/09/20 1204  WBC 7.9 7.5 7.9  NEUTROABS 6.3  --   --   HGB 11.0* 11.2* 10.4*  HCT 34.2* 35.0* 30.6*  MCV 84.0 85.0 82.3  PLT 747* 734* 571*    Basic Metabolic Panel: Recent Labs  Lab 09/05/20 0726 09/09/20 1204  NA 136 133*  K 3.4* 3.4*  CL 103 103  CO2 22 24  GLUCOSE 127* 91  BUN 13 <5*  CREATININE 0.79 0.67  CALCIUM 9.0 8.4*  MG  --  1.6*  PHOS  --  3.1   GFR: Estimated Creatinine Clearance: 78.8 mL/min (by C-G formula based on SCr of 0.67 mg/dL). Recent Labs  Lab 09/05/20 0726 09/05/20 1955 09/09/20 1204  WBC 7.9 7.5 7.9    Liver Function Tests: Recent Labs  Lab 09/05/20 0726 09/09/20 1204  AST 24 16  ALT 14 13  ALKPHOS 35* 29*  BILITOT 0.5 0.3  PROT 7.8 6.0*  ALBUMIN 4.2 3.0*   No results for input(s): LIPASE, AMYLASE in the last 168 hours. No results for input(s): AMMONIA in the last 168 hours.  ABG No results found for: PHART, PCO2ART, PO2ART, HCO3, TCO2, ACIDBASEDEF, O2SAT   Coagulation Profile: Recent Labs  Lab 09/05/20 1955  INR 1.1    Cardiac Enzymes: No results for input(s): CKTOTAL, CKMB, CKMBINDEX, TROPONINI in the last 168 hours.  HbA1C: No results found for: HGBA1C  CBG: No results for input(s): GLUCAP in the last 168 hours.   Critical care time:    Performed by: Delfin Gant  Total critical care time: 38 minutes  Critical care time was exclusive of separately billable procedures and treating other patients.  Critical care was necessary to treat or prevent imminent or life-threatening deterioration.  Critical care was time spent personally by me on the following activities: development of treatment plan  with patient and/or surrogate as well as nursing, discussions with consultants, evaluation of patient's response to treatment, examination of patient, obtaining history from patient or surrogate, ordering and performing treatments and interventions, ordering and review of laboratory studies, ordering and review of radiographic studies, pulse oximetry and re-evaluation of patient's condition.  Delfin Gant, NP-C [facility] Personal contact information can be found on Amion  If no response please page: Adult pulmonary and critical care medicine pager on Amion unitl 7pm After 7pm please call [phone] 09/10/2020, 9:18 AM

## 2020-09-10 NOTE — Progress Notes (Signed)
Physical Therapy Treatment Patient Details Name: Lori Pierce MRN: [id number] DOB: [birth year] Today's Date: 09/10/2020    History of Present Illness Pt is a 45 y.o. F admitted 3/6 with WHOL. 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 Significant PMH: asthma, allergies.    PT Comments    The pt was seen this morning to progress OOB mobility with monitoring of BP and safety awareness. However, the pt remained focused on personal needs such as charging her phone at this time, and was only agreeable to short ambulation in the room at this time. Per NP,  SBP goal of >180, which was not maintained through session (see vitals below), NP present and aware. Pt with continued L-sided inattention noted through session both with supine activity as well as with standing mobility. The pt was able to attend and correct when cued, but attention not maintained. The pt also demos poor safety awareness with mobility, and had multiple LOB with short bout of ambulation today requiring minA-modA to maintain upright due to poor motor planning, awareness, and reactions. The pt will continue to benefit from skilled PT to progress functional mobility, stability, and safety with mobility.   BP during session - 162/160 (122) supine in bed prior to session - 173/110 (128) in standing - 172/122 (137) standing in bathroom - 167/109 (127) supine at end of session     Follow Up Recommendations  Outpatient PT;Supervision for mobility/OOB     Equipment Recommendations  None recommended by PT    Recommendations for Other Services       Precautions / Restrictions Precautions Precautions: Fall Precaution Comments: decreased attention to L-side, decreased safety awareness Restrictions Weight Bearing Restrictions: No Other Position/Activity Restrictions: SBP goal > 180    Mobility  Bed  Mobility Overal bed mobility: Independent             General bed mobility comments: HOB flat, decreased awareness of LUE, cues to reposition as pt with arm stuck between rail and bed without making any move to adjust    Transfers Overall transfer level: Needs assistance Equipment used: None Transfers: Sit to/from Stand Sit to Stand: Supervision         General transfer comment: super vision for safety/line management  Ambulation/Gait Ambulation/Gait assistance: Min guard;Mod assist Gait Distance (Feet): 15 Feet (x2) Assistive device: None Gait Pattern/deviations: Step-through pattern;Decreased stride length;Decreased step length - left;Staggering left;Staggering right;Narrow base of support     General Gait Details: pt with multiple LOB during short bout of walking in room, reaching to carry her bag from bathroom to room with multiple LOB during reaching and carrying requiring minA to modA. multiple LOB with staggering steps and pt unable to correct without assist.      Modified Rankin (Stroke Patients Only) Modified Rankin (Stroke Patients Only) Pre-Morbid Rankin Score: No symptoms Modified Rankin: Moderately severe disability     Balance Overall balance assessment: Needs assistance Sitting-balance support: Feet supported Sitting balance-Leahy Scale: Good     Standing balance support: No upper extremity supported;During functional activity Standing balance-Leahy Scale: Poor Standing balance comment: multiple LOB today with decreased safety awareness and problem solving. minG to modA to maintain with short bout of walking                            Cognition Arousal/Alertness: Awake/alert Behavior During Therapy: Flat affect  Overall Cognitive Status: Impaired/Different from baseline Area of Impairment: Attention;Safety/judgement;Awareness;Problem solving                   Current Attention Level: Focused (decreased attention to L side  without cues, unable to maintain cues for more than 3-5 seconds)     Safety/Judgement: Decreased awareness of safety;Decreased awareness of deficits Awareness: Intellectual;Emergent Problem Solving: Slow processing;Decreased initiation;Difficulty sequencing;Requires verbal cues General Comments: Pt requiring significant increased cues for safety, decreased asareness of LUE during session requiring increased time to maneuver or repeated cues to attend/reposition. Pt with flat affect, perseverating on getting phone charged to call friend so that they "dont get mad" at her. Only following cues/commands that also align with her goal of washing up and getting phone charged. Pt with multiple LOB with gait, no change to movement despite LOB, required minA to modA to maintain upright      Exercises      General Comments General comments (skin integrity, edema, etc.): BP 162/160 (122) supine in bed prior to session; BP 173/110 (128) standing; BP 172/122 (137) standing in bathroom; 167/109 (127) supine at end of session      Pertinent Vitals/Pain Pain Assessment: Faces Faces Pain Scale: Hurts a little bit Pain Location: headache Pain Intervention(s): Monitored during session;Repositioned           PT Goals (current goals can now be found in the care plan section) Acute Rehab PT Goals Patient Stated Goal: Return to home and work PT Goal Formulation: With patient Time For Goal Achievement: 09/21/20 Potential to Achieve Goals: Good Progress towards PT goals: Not progressing toward goals - comment (pt focused on phone call, declining mobility)    Frequency    Min 4X/week      PT Plan Current plan remains appropriate       AM-PAC PT "6 Clicks" Mobility   Outcome Measure  Help needed turning from your back to your side while in a flat bed without using bedrails?: None Help needed moving from lying on your back to sitting on the side of a flat bed without using bedrails?: None Help  needed moving to and from a bed to a chair (including a wheelchair)?: A Little Help needed standing up from a chair using your arms (e.g., wheelchair or bedside chair)?: A Little Help needed to walk in hospital room?: A Little Help needed climbing 3-5 steps with a railing? : A Little 6 Click Score: 20    End of Session Equipment Utilized During Treatment: Gait belt Activity Tolerance: Patient tolerated treatment well Patient left: in bed;with call bell/phone within reach Nurse Communication: Mobility status PT Visit Diagnosis: Unsteadiness on feet (R26.81)     Time: 3149-7026 PT Time Calculation (min) (ACUTE ONLY): 29 min  Charges:  $Gait Training: 8-22 mins $Therapeutic Activity: 8-22 mins                     Rolm Baptise, PT, DPT   Acute Rehabilitation Department Pager #: [phone]   Gaetana Michaelis 09/10/2020, 9:56 AM

## 2020-09-11 LAB — BASIC METABOLIC PANEL
Anion gap: 10 (ref 5–15)
BUN: 5 mg/dL — ABNORMAL LOW (ref 6–20)
CO2: 24 mmol/L (ref 22–32)
Calcium: 8.4 mg/dL — ABNORMAL LOW (ref 8.9–10.3)
Chloride: 100 mmol/L (ref 98–111)
Creatinine, Ser: 0.75 mg/dL (ref 0.44–1.00)
GFR, Estimated: >60 mL/min (ref >60)
Glucose, Bld: 161 mg/dL — ABNORMAL HIGH (ref 70–99)
Potassium: 3.2 mmol/L — ABNORMAL LOW (ref 3.5–5.1)
Sodium: 134 mmol/L — ABNORMAL LOW (ref 135–145)

## 2020-09-11 LAB — MAGNESIUM: Magnesium: 2.1 mg/dL (ref 1.7–2.4)

## 2020-09-11 MED ORDER — POTASSIUM CHLORIDE CRYS ER 20 MEQ PO TBCR
40.0000 meq | EXTENDED_RELEASE_TABLET | Freq: Two times a day (BID) | ORAL | Status: AC
  Administered 2020-09-11 (×2): 40 meq via ORAL
  Filled 2020-09-11 (×2): qty 2

## 2020-09-11 MED ORDER — POLYETHYLENE GLYCOL 3350 17 G PO PACK
17.0000 g | PACK | Freq: Two times a day (BID) | ORAL | Status: DC
  Administered 2020-09-11 – 2020-09-20 (×14): 17 g via ORAL
  Filled 2020-09-11 (×15): qty 1

## 2020-09-11 NOTE — Progress Notes (Signed)
Subjective: Patient reports Doing okay condition headache  Objective: Vital signs in last 24 hours: Temp:  [96.7 F (35.9 C)-100.7 F (38.2 C)] 96.7 F (35.9 C) (03/12 0402) Pulse Rate:  [79-117] 107 (03/12 0800) Resp:  [11-26] 13 (03/12 0800) BP: (109-175)/(61-112) 159/108 (03/12 0800) SpO2:  [94 %-99 %] 97 % (03/12 0800)  Intake/Output from previous day: 03/11 0701 - 03/12 0700 In: 1794.6 [P.O.:720; I.V.:974.6; IV Piggyback:100] Out: 2375 [Urine:2375] Intake/Output this shift: Total I/O In: 73.4 [I.V.:73.4] Out: 60 [Urine:60]  Awake alert strength 5 of 5 upper and lower extremities.  Nipples equal and reactive.  Lab Results: Recent Labs    09/09/20 1204  WBC 7.9  HGB 10.4*  HCT 30.6*  PLT 571*   BMET Recent Labs    09/09/20 1204 09/11/20 0500  NA 133* 134*  K 3.4* 3.2*  CL 103 100  CO2 24 24  GLUCOSE 91 161*  BUN <5* 5*  CREATININE 0.67 0.75  CALCIUM 8.4* 8.4*    Studies/Results: VAS [place] TRANSCRANIAL DOPPLER  Result Date: 09/10/2020  Transcranial Doppler Indications: Subarachnoid hemorrhage. Comparison Study: 09-08-2020 TCD showed elevated mean flow velocities in both                   middle cerebral arteries suggestive of mild vasospasm. Performing Technologist: Jean Rosenthal RDMS,RVT  Examination Guidelines: A complete evaluation includes B-mode imaging, spectral Doppler, color Doppler, and power Doppler as needed of all accessible portions of each vessel. Bilateral testing is considered an integral part of a complete examination. Limited examinations for reoccurring indications may be performed as noted.  +----------+-------------+----------+-----------+-------+ RIGHT TCD Right VM (cm)Depth (cm)PulsatilityComment +----------+-------------+----------+-----------+-------+ MCA          124.00       5.20      0.74            +----------+-------------+----------+-----------+-------+ ACA           22.00       5.20      0.85             +----------+-------------+----------+-----------+-------+ Term ICA     119.00       5.50      0.70            +----------+-------------+----------+-----------+-------+ PCA           36.00       6.10      0.72            +----------+-------------+----------+-----------+-------+ Opthalmic     30.00       3.30      1.37            +----------+-------------+----------+-----------+-------+ ICA siphon    35.00       4.90      1.77            +----------+-------------+----------+-----------+-------+ Vertebral     39.00       5.50      1.22            +----------+-------------+----------+-----------+-------+ Distal ICA    26.00                 1.44            +----------+-------------+----------+-----------+-------+  +----------+------------+----------+-----------+-------+ LEFT TCD  Left VM (cm)Depth (cm)PulsatilityComment +----------+------------+----------+-----------+-------+ MCA          96.00       3.60       1.0            +----------+------------+----------+-----------+-------+  ACA          48.00       5.30      0.87            +----------+------------+----------+-----------+-------+ Term ICA     86.00       5.20      0.91            +----------+------------+----------+-----------+-------+ PCA          53.00       5.60      1.02            +----------+------------+----------+-----------+-------+ Opthalmic    28.00       3.20      1.60            +----------+------------+----------+-----------+-------+ ICA siphon   31.00       4.90      1.77            +----------+------------+----------+-----------+-------+ Distal ICA   30.00                 1.05            +----------+------------+----------+-----------+-------+  +----------------------+----+ Right Lindegaard Ratio4.77 +----------------------+----+ +---------------------+----+ Left Lindegaard Ratio3.20 +---------------------+----+    Preliminary    [place] EKG SITE RITE  Result  Date: 09/09/2020 If Site Rite image not attached, placement could not be confirmed due to current cardiac rhythm.   Assessment/Plan: Post embolization day 6 continue triple H therapy blood pressure diastolic elevation currently maxed out on Levophed we will try to dilate down just a little bit but will titrate to symptoms.  Maintain hypertensive therapy.  LOS: 6 days     Mariam Dollar 09/11/2020, 8:29 AM

## 2020-09-11 NOTE — Progress Notes (Signed)
NAMEAmelda Pierce, MRN:  [id number], DOB:  17-Dec-1975, LOS: 6 ADMISSION DATE:  09/05/2020, CONSULTATION DATE:  09/09/2020 REFERRING MD:  Dr. 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 3/11 PICC line  Significant Diagnostic Tests:  CT angio head 3/6 > 1. 5 mm right supraclinoid ICA aneurysm, presumably the symptomatic lesion.  2 mm right supraclinoid ICA outpouching which could be infundibulum or aneurysm, attention on catheter angiography.  IR angiogram 3/7 >1. Successful coil embolization of a right posterior communicating artery aneurysm without local thrombotic or distal embolic complication noted.   Micro Data:  MRSA PCR 3/6 > Negative   Antimicrobials:     Interim History / Subjective:  No overnight event Remains on high-dose vasopressors with Levophed Objective   Blood pressure (!) 159/108, pulse (!) 107, temperature (!) 96.7 F (35.9 C), temperature source Axillary, resp. rate 13, height 5\' 4"  (1.626 m), weight 54.5 kg, last menstrual period 09/03/2020, SpO2 97 %.        Intake/Output Summary (Last 24 hours) at 09/11/2020 11/11/2020 Last data filed at 09/11/2020 0800 Gross per 24 hour  Intake 1711.45 ml  Output 2435 ml  Net -723.55 ml   Filed Weights   09/06/20 1021  Weight: 54.5 kg    Examination: General: well developed adult female, lying in the bed HEENT: [place]/AT, MM pink/moist, PERRL,  Neuro: alert and oriented x3, strength 5/5 in all extremities, pupils equal round react  light, CV: s1s2 regular rate and rhythm, no murmur, rubs, or gallops,  PULM:  Clear to ascultation bilaterally, no added breath sounds, no increased work of breathing  GI: soft, bowel sounds active in all 4 quadrants, non-tender, non-distended Extremities: warm/dry, no edema  Skin: no rashes or lesions  Resolved Hospital Problem list     Assessment & Plan:  Aneurysmal subarachnoid hemorrhage H/H 2, 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 status post coiling Vasospasm, now pressure dependent Hyponatremia  Patient remained asymptomatic, once blood pressure augmented Continue Levophed, try to titrated down to her neurological symptom Continue neuro check every hour Continue nimodipine Maintain euvolemia and normal natremia TCD's daily   Best practice (evaluated daily)  Diet: Reg Pain/Anxiety/Delirium protocol (if indicated): As needed  VAP protocol (if indicated): N/A DVT prophylaxis: SCD GI prophylaxis: PPI Glucose control: Monitor  Mobility: As tolerated Disposition:ICU   Goals of Care:  Per primary   Labs   CBC: Recent Labs  Lab 09/05/20 0726 09/05/20 1955 09/09/20 1204  WBC 7.9 7.5 7.9  NEUTROABS 6.3  --   --   HGB 11.0* 11.2* 10.4*  HCT 34.2* 35.0* 30.6*  MCV 84.0 85.0 82.3  PLT 747* 734* 571*    Basic Metabolic Panel: Recent Labs  Lab 09/05/20 0726 09/09/20 1204 09/11/20 0500  NA 136 133* 134*  K 3.4* 3.4* 3.2*  CL 103 103 100  CO2 22 24 24   GLUCOSE 127* 91 161*  BUN 13 <5* 5*  CREATININE 0.79 0.67 0.75  CALCIUM 9.0 8.4* 8.4*  MG  --  1.6*  2.1  PHOS  --  3.1  --    GFR: Estimated Creatinine Clearance: 78.8 mL/min (by C-G formula based on SCr of 0.75 mg/dL). Recent Labs  Lab 09/05/20 0726 09/05/20 1955 09/09/20 1204  WBC 7.9 7.5 7.9    Liver Function Tests: Recent Labs  Lab 09/05/20 0726 09/09/20 1204  AST 24 16  ALT 14 13  ALKPHOS 35* 29*  BILITOT 0.5 0.3  PROT 7.8 6.0*  ALBUMIN 4.2 3.0*   No results for input(s): LIPASE,  AMYLASE in the last 168 hours. No results for input(s): AMMONIA in the last 168 hours.  ABG No results found for: PHART, PCO2ART, PO2ART, HCO3, TCO2, ACIDBASEDEF, O2SAT   Coagulation Profile: Recent Labs  Lab 09/05/20 1955  INR 1.1    Cardiac Enzymes: No results for input(s): CKTOTAL, CKMB, CKMBINDEX, TROPONINI in the last 168 hours.  HbA1C: No results found for: HGBA1C  CBG: No results for input(s): GLUCAP in the last 168 hours.   Critical care time:    Total critical care time: 35 minutes  Performed by: Cheri Fowler   Critical care time was exclusive of separately billable procedures and treating other patients.   Critical care was necessary to treat or prevent imminent or life-threatening deterioration.   Critical care was time spent personally by me on the following activities: development of treatment plan with patient and/or surrogate as well as nursing, discussions with consultants, evaluation of patient's response to treatment, examination of patient, obtaining history from patient or surrogate, ordering and performing treatments and interventions, ordering and review of laboratory studies, ordering and review of radiographic studies, pulse oximetry and re-evaluation of patient's condition.   Cheri Fowler MD [facility] See Amion for pager If no response to pager, please call [phone] until 7pm After 7pm, Please call E-link [phone]

## 2020-09-12 LAB — BASIC METABOLIC PANEL
Anion gap: 7 (ref 5–15)
BUN: 6 mg/dL (ref 6–20)
CO2: 23 mmol/L (ref 22–32)
Calcium: 8.2 mg/dL — ABNORMAL LOW (ref 8.9–10.3)
Chloride: 103 mmol/L (ref 98–111)
Creatinine, Ser: 0.66 mg/dL (ref 0.44–1.00)
GFR, Estimated: >60 mL/min (ref >60)
Glucose, Bld: 120 mg/dL — ABNORMAL HIGH (ref 70–99)
Potassium: 3.5 mmol/L (ref 3.5–5.1)
Sodium: 133 mmol/L — ABNORMAL LOW (ref 135–145)

## 2020-09-12 MED ORDER — POTASSIUM CHLORIDE CRYS ER 20 MEQ PO TBCR
20.0000 meq | EXTENDED_RELEASE_TABLET | Freq: Once | ORAL | Status: AC
  Administered 2020-09-12: 20 meq via ORAL
  Filled 2020-09-12: qty 1

## 2020-09-12 MED ORDER — POTASSIUM CHLORIDE 20 MEQ PO PACK: 40.0000 meq | PACK | Freq: Three times a day (TID) | ORAL | Status: DC

## 2020-09-12 MED ORDER — POTASSIUM CHLORIDE CRYS ER 20 MEQ PO TBCR
40.0000 meq | EXTENDED_RELEASE_TABLET | Freq: Once | ORAL | Status: AC
  Administered 2020-09-12: 40 meq via ORAL
  Filled 2020-09-12: qty 2

## 2020-09-12 NOTE — Progress Notes (Signed)
NAMEMiranda Pierce, MRN:  [id number], DOB:  10-Feb-1976, LOS: 7 ADMISSION DATE:  09/05/2020, CONSULTATION DATE:  09/09/2020 REFERRING MD:  Dr. 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 3/11 PICC line  Significant Diagnostic Tests:  CT angio head 3/6 > 1. 5 mm right supraclinoid ICA aneurysm, presumably the symptomatic lesion.  2 mm right supraclinoid ICA outpouching which could be infundibulum or aneurysm, attention on catheter angiography.  IR angiogram 3/7 >1. Successful coil embolization of a right posterior communicating artery aneurysm without local thrombotic or distal embolic complication noted.   Micro Data:  MRSA PCR 3/6 > Negative   Antimicrobials:     Interim History / Subjective:  Patient remains on Levophed 30 mics Stable neurological exam Objective   Blood pressure (!) 157/109, pulse 98, temperature 99.3 F (37.4 C), temperature source Oral, resp. rate 12, height 5\' 4"  (1.626 m), weight 54.5 kg, last menstrual period 09/03/2020, SpO2 98 %.        Intake/Output Summary (Last 24 hours) at 09/12/2020 0857 Last data filed at 09/12/2020 0600 Gross per 24 hour  Intake 1250.42 ml  Output 1675 ml  Net -424.58 ml   Filed Weights   09/06/20 1021  Weight: 54.5 kg    Examination: General: Young African-American female, lying in the bed HEENT: [place]/AT, MM pink/moist, PERRL, dry mucous membranes Neuro: Alert, awake and oriented x3, strength 5/5 in all extremities, pupils equal  round react light,  CV: s1s2 regular rate and rhythm, no murmur, rubs, or gallops,  PULM:  Clear to ascultation bilaterally, no added breath sounds, no increased work of breathing  GI: soft, bowel sounds active in all 4 quadrants, non-tender, non-distended Extremities: warm/dry, no edema  Skin: no rashes or lesions  Resolved Hospital Problem list     Assessment & Plan:  Aneurysmal subarachnoid hemorrhage H/H 2, 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 status post coiling Vasospasm, now pressure dependent Hyponatremia  Patient remained asymptomatic, once blood pressure augmented Continue Levophed, try to titrated down to her neurological symptom SBP goal 160-180 Continue neuro check every hour Continue nimodipine Serum sodium is 134 Completed 7-day course with Keppra for seizure prophylaxis Maintain euvolemia and normonatremia TCD's daily   Best practice (evaluated daily)  Diet: Regular diet Pain/Anxiety/Delirium protocol (if indicated): As needed  VAP protocol (if indicated): N/A DVT prophylaxis: SCD GI prophylaxis: PPI Glucose control: Monitor  Mobility: As tolerated Disposition:ICU   Goals of Care:  Per primary   Labs   CBC: Recent Labs  Lab 09/05/20 1955 09/09/20 1204  WBC 7.5 7.9  HGB 11.2* 10.4*  HCT 35.0* 30.6*  MCV 85.0 82.3  PLT 734* 571*    Basic Metabolic Panel: Recent Labs  Lab 09/09/20 1204 09/11/20 0500  NA 133* 134*  K 3.4* 3.2*  CL 103 100  CO2 24 24  GLUCOSE 91 161*  BUN <5* 5*  CREATININE 0.67 0.75  CALCIUM 8.4* 8.4*  MG 1.6* 2.1  PHOS 3.1  --    GFR: Estimated Creatinine Clearance:  78.8 mL/min (by C-G formula based on SCr of 0.75 mg/dL). Recent Labs  Lab 09/05/20 1955 09/09/20 1204  WBC 7.5 7.9    Liver Function Tests: Recent Labs  Lab 09/09/20 1204  AST 16  ALT 13  ALKPHOS 29*  BILITOT 0.3  PROT 6.0*  ALBUMIN 3.0*   No results for input(s): LIPASE, AMYLASE in the last 168 hours. No results for input(s):  AMMONIA in the last 168 hours.  ABG No results found for: PHART, PCO2ART, PO2ART, HCO3, TCO2, ACIDBASEDEF, O2SAT   Coagulation Profile: Recent Labs  Lab 09/05/20 1955  INR 1.1    Cardiac Enzymes: No results for input(s): CKTOTAL, CKMB, CKMBINDEX, TROPONINI in the last 168 hours.  HbA1C: No results found for: HGBA1C  CBG: No results for input(s): GLUCAP in the last 168 hours.   Critical care time:    Total critical care time: 33 minutes  Performed by: Cheri Fowler   Critical care time was exclusive of separately billable procedures and treating other patients.   Critical care was necessary to treat or prevent imminent or life-threatening deterioration.   Critical care was time spent personally by me on the following activities: development of treatment plan with patient and/or surrogate as well as nursing, discussions with consultants, evaluation of patient's response to treatment, examination of patient, obtaining history from patient or surrogate, ordering and performing treatments and interventions, ordering and review of laboratory studies, ordering and review of radiographic studies, pulse oximetry and re-evaluation of patient's condition.   Cheri Fowler MD [facility] See Amion for pager If no response to pager, please call [phone] until 7pm After 7pm, Please call E-link [phone]

## 2020-09-12 NOTE — Progress Notes (Signed)
Pt currently maxed on Levophed, but not meeting  SBP goals of 160-180 as BPs have been 140s/90s.  Pt still mentating appropriately, but also complains of L hand tingling that comes and goes.  Dr. Maurice Small, neuroSx on call, notified of situation.  Advised to not add another pressor as long as pt continues to [facility] appropriately.  Will continue to monitor.

## 2020-09-12 NOTE — Progress Notes (Signed)
Patient with orders for restricted visitation. Received phone call from patient's mother asking to come in. Per Dr. Merrily Pew, patient's mother may visit. Upon arrival, visitor's belongings searched by this RN including pocketbook, two large wallets, and bag of food.

## 2020-09-12 NOTE — Progress Notes (Signed)
Subjective: Patient reports Headache slightly improved from yesterday denies any nausea  Objective: Vital signs in last 24 hours: Temp:  [99.1 F (37.3 C)-99.4 F (37.4 C)] 99.4 F (37.4 C) (03/13 0400) Pulse Rate:  [64-115] 69 (03/13 0600) Resp:  [12-27] 18 (03/13 0600) BP: (93-163)/(52-113) 126/83 (03/13 0600) SpO2:  [92 %-100 %] 95 % (03/13 0600)  Intake/Output from previous day: 03/12 0701 - 03/13 0700 In: 1323.8 [P.O.:540; I.V.:783.8] Out: 1735 [Urine:1735] Intake/Output this shift: No intake/output data recorded.  Patient is awake and alert strength is 5-5 may be a slight left pronator drift blood pressure remains good at 160/100 continue to titrate Levophed targeting systolic pressures 160-180 and diastolics around 95  Lab Results: Recent Labs    09/09/20 1204  WBC 7.9  HGB 10.4*  HCT 30.6*  PLT 571*   BMET Recent Labs    09/09/20 1204 09/11/20 0500  NA 133* 134*  K 3.4* 3.2*  CL 103 100  CO2 24 24  GLUCOSE 91 161*  BUN <5* 5*  CREATININE 0.67 0.75  CALCIUM 8.4* 8.4*    Studies/Results: VAS [place] TRANSCRANIAL DOPPLER  Result Date: 09/11/2020  Transcranial Doppler Indications: Subarachnoid hemorrhage. Comparison Study: 09-08-2020 TCD showed elevated mean flow velocities in both                   middle cerebral arteries suggestive of mild vasospasm. Performing Technologist: Jean Rosenthal RDMS,RVT  Examination Guidelines: A complete evaluation includes B-mode imaging, spectral Doppler, color Doppler, and power Doppler as needed of all accessible portions of each vessel. Bilateral testing is considered an integral part of a complete examination. Limited examinations for reoccurring indications may be performed as noted.  +----------+-------------+----------+-----------+-------+ RIGHT TCD Right VM (cm)Depth (cm)PulsatilityComment +----------+-------------+----------+-----------+-------+ MCA          124.00       5.20      0.74             +----------+-------------+----------+-----------+-------+ ACA           22.00       5.20      0.85            +----------+-------------+----------+-----------+-------+ Term ICA     119.00       5.50      0.70            +----------+-------------+----------+-----------+-------+ PCA           36.00       6.10      0.72            +----------+-------------+----------+-----------+-------+ Opthalmic     30.00       3.30      1.37            +----------+-------------+----------+-----------+-------+ ICA siphon    35.00       4.90      1.77            +----------+-------------+----------+-----------+-------+ Vertebral     39.00       5.50      1.22            +----------+-------------+----------+-----------+-------+ Distal ICA    26.00                 1.44            +----------+-------------+----------+-----------+-------+  +----------+------------+----------+-----------+-------+ LEFT TCD  Left VM (cm)Depth (cm)PulsatilityComment +----------+------------+----------+-----------+-------+ MCA          96.00       3.60  1.0            +----------+------------+----------+-----------+-------+ ACA          48.00       5.30      0.87            +----------+------------+----------+-----------+-------+ Term ICA     86.00       5.20      0.91            +----------+------------+----------+-----------+-------+ PCA          53.00       5.60      1.02            +----------+------------+----------+-----------+-------+ Opthalmic    28.00       3.20      1.60            +----------+------------+----------+-----------+-------+ ICA siphon   31.00       4.90      1.77            +----------+------------+----------+-----------+-------+ Distal ICA   30.00                 1.05            +----------+------------+----------+-----------+-------+  +----------------------+----+ Right Lindegaard Ratio4.77 +----------------------+----+  +---------------------+----+ Left Lindegaard Ratio3.20 +---------------------+----+  Summary:  Elevated right middle cerebral and terminal ICA mean flow velocities suggestive of mild vasospasm and slightly elevated left middle cerebral and terminal Left ICA mean flow velocities suggestive of incipient vasospasm. *See table(s) above for TCD measurements and observations.  Diagnosing physician: Delia Heady MD Electronically signed by Delia Heady MD on 09/11/2020 at 12:54:24 PM.    Final     Assessment/Plan: Post bleed day 7 post coil day 7 evidence of slight vasospasm seems to respond to hypertensive treatment.  Continue Levophed titrated for Stoller pressures 160-180.  LOS: 7 days     Mariam Dollar 09/12/2020, 8:02 AM

## 2020-09-13 ENCOUNTER — Inpatient Hospital Stay (HOSPITAL_COMMUNITY): Payer: No Typology Code available for payment source

## 2020-09-13 DIAGNOSIS — F1990 Other psychoactive substance use, unspecified, uncomplicated: Secondary | ICD-10-CM

## 2020-09-13 DIAGNOSIS — I609 Nontraumatic subarachnoid hemorrhage, unspecified: Secondary | ICD-10-CM | POA: Diagnosis not present

## 2020-09-13 DIAGNOSIS — I67848 Other cerebrovascular vasospasm and vasoconstriction: Secondary | ICD-10-CM

## 2020-09-13 LAB — HEMOGLOBIN A1C
Hgb A1c MFr Bld: 5.4 % (ref 4.8–5.6)
Mean Plasma Glucose: 108.28 mg/dL

## 2020-09-13 LAB — BASIC METABOLIC PANEL
Anion gap: 9 (ref 5–15)
BUN: 9 mg/dL (ref 6–20)
CO2: 23 mmol/L (ref 22–32)
Calcium: 8.8 mg/dL — ABNORMAL LOW (ref 8.9–10.3)
Chloride: 106 mmol/L (ref 98–111)
Creatinine, Ser: 0.6 mg/dL (ref 0.44–1.00)
GFR, Estimated: >60 mL/min (ref >60)
Glucose, Bld: 147 mg/dL — ABNORMAL HIGH (ref 70–99)
Potassium: 3.3 mmol/L — ABNORMAL LOW (ref 3.5–5.1)
Sodium: 138 mmol/L (ref 135–145)

## 2020-09-13 LAB — GLUCOSE, CAPILLARY
Glucose-Capillary: 146 mg/dL — ABNORMAL HIGH (ref 70–99)
Glucose-Capillary: 165 mg/dL — ABNORMAL HIGH (ref 70–99)
Glucose-Capillary: 190 mg/dL — ABNORMAL HIGH (ref 70–99)

## 2020-09-13 LAB — MAGNESIUM: Magnesium: 1.8 mg/dL (ref 1.7–2.4)

## 2020-09-13 MED ORDER — ADULT MULTIVITAMIN W/MINERALS CH
1.0000 | ORAL_TABLET | Freq: Every day | ORAL | Status: DC
  Administered 2020-09-13 – 2020-09-20 (×8): 1 via ORAL
  Filled 2020-09-13 (×8): qty 1

## 2020-09-13 MED ORDER — ENSURE ENLIVE PO LIQD
237.0000 mL | Freq: Two times a day (BID) | ORAL | Status: DC
  Administered 2020-09-13 (×2): 237 mL via ORAL

## 2020-09-13 MED ORDER — LIDOCAINE 5 % EX PTCH
1.0000 | MEDICATED_PATCH | CUTANEOUS | Status: DC
  Administered 2020-09-13 – 2020-09-14 (×2): 1 via TRANSDERMAL
  Filled 2020-09-13 (×2): qty 1

## 2020-09-13 MED ORDER — POTASSIUM CHLORIDE CRYS ER 20 MEQ PO TBCR
40.0000 meq | EXTENDED_RELEASE_TABLET | ORAL | Status: AC
  Administered 2020-09-13 (×2): 40 meq via ORAL
  Filled 2020-09-13 (×2): qty 2

## 2020-09-13 MED ORDER — ENSURE ENLIVE PO LIQD
237.0000 mL | Freq: Three times a day (TID) | ORAL | Status: DC
  Administered 2020-09-13 – 2020-09-20 (×21): 237 mL via ORAL
  Filled 2020-09-13 (×3): qty 237

## 2020-09-13 MED ORDER — SODIUM CHLORIDE 0.9 % IV SOLN: INTRAVENOUS | Status: DC

## 2020-09-13 MED ORDER — DEXAMETHASONE SODIUM PHOSPHATE 4 MG/ML IJ SOLN
4.0000 mg | Freq: Four times a day (QID) | INTRAMUSCULAR | Status: DC
  Administered 2020-09-13 – 2020-09-16 (×12): 4 mg via INTRAVENOUS
  Filled 2020-09-13 (×13): qty 1

## 2020-09-13 MED ORDER — INSULIN ASPART 100 UNIT/ML ~~LOC~~ SOLN
0.0000 [IU] | Freq: Three times a day (TID) | SUBCUTANEOUS | Status: DC
  Administered 2020-09-13: 2 [IU] via SUBCUTANEOUS
  Administered 2020-09-13 – 2020-09-14 (×2): 1 [IU] via SUBCUTANEOUS
  Administered 2020-09-14: 2 [IU] via SUBCUTANEOUS
  Administered 2020-09-14: 3 [IU] via SUBCUTANEOUS
  Administered 2020-09-15 (×3): 2 [IU] via SUBCUTANEOUS

## 2020-09-13 NOTE — Progress Notes (Signed)
Initial Nutrition Assessment  DOCUMENTATION CODES:  Non-severe (moderate) malnutrition in context of social or environmental circumstances  INTERVENTION:  Continue Ensure Enlive po TID, each supplement provides 350 kcal and 20 grams of protein.  Add MVI with minerals.  NUTRITION DIAGNOSIS:  Moderate Malnutrition related to social / environmental circumstances (likely long-term cocaine abuse) as evidenced by moderate muscle depletion,moderate fat depletion.  GOAL:  Patient will meet greater than or equal to 90% of their needs  MONITOR:  PO intake,Supplement acceptance  REASON FOR ASSESSMENT:  Malnutrition Screening Tool   ASSESSMENT:  45 yo female with PMH of asthma and allergies who presents with SAH, ruptured aneurysm. 3/7 coil embolization 3/10 CCM consult for suspected vasospasm 3/11 UDS positive for cocaine 3/14 started back on NS, continued on NE and nimotop. 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  Per Epic, for meals documented, pt eating 50-75% of meals. Pt on phone during visit and unwilling to participate in conversation. Epic reports stable weight since 2017.  Exam showed moderate depletions throughout the body.  Encouraged intake as much as possible.  Relevant Medications: dexamethasone, colace BID, SSI, miralax BID, Klor-Con 40 mEq, levophed, Fioricet, oxycodone Labs: reviewed; K 3.3, Glucose 147 HbA1c: 5.4% (08/2020)  NUTRITION - FOCUSED PHYSICAL EXAM: Flowsheet Row Most Recent Value  Orbital Region Mild depletion  Upper Arm Region Severe depletion  Thoracic and Lumbar Region Moderate depletion  Buccal Region Moderate depletion  Temple Region Moderate depletion  Clavicle Bone Region Moderate depletion  Clavicle and Acromion Bone Region Moderate depletion  Scapular Bone Region Mild depletion  Dorsal Hand Moderate depletion  Patellar Region Moderate depletion  Anterior Thigh Region Moderate depletion  Posterior  Calf Region Severe depletion  Edema (RD Assessment) None  Hair Reviewed  Eyes Reviewed  Mouth Reviewed  Skin Reviewed  Nails Reviewed     Diet Order:   Diet Order            Diet regular Room service appropriate? Yes; Fluid consistency: Thin  Diet effective now                EDUCATION NEEDS:  Education needs have been addressed  Skin:  Skin Assessment: Reviewed RN Assessment  Last BM:  PTA - on OBR  Height:  Ht Readings from Last 1 Encounters:  09/06/20 5\' 4"  (1.626 m)   Weight:  Wt Readings from Last 1 Encounters:  09/06/20 54.5 kg   Ideal Body Weight:  54.4 kg  BMI:  Body mass index is 20.62 kg/m.  Estimated Nutritional Needs:  Kcal:  1800-2000 Protein:  90-105 grams Fluid:  >2 L  11/06/20, RD, LDN Registered Dietitian After Hours/Weekend Pager # in Amion

## 2020-09-13 NOTE — Progress Notes (Signed)
Transcranial Doppler  Date POD PCO2 HCT BP  MCA ACA PCA OPHT SIPH VERT Basilar  3/8 MR     Right  Left   102  89   -36  -26   -43  31   30  18    68  41   -30  -27   -43      3/9 MR     Right  Left   168  119   -44  -53   49  42   23  18   48  52   -23  -32   -40      3/11 RH     Right  Left   124  96   22  48   36  53   30  28   35  31   39  *         3/14 MR      Right  Left   119  144   -28  *   47  51   25  27   53  97   -32  -31   -41            Right  Left                                            Right  Left                                            Right  Left                                        MCA = Middle Cerebral Artery      OPHT = Opthalmic Artery     BASILAR = Basilar Artery   ACA = Anterior Cerebral Artery     SIPH = Carotid Siphon PCA = Posterior Cerebral Artery   VERT = Verterbral Artery                   Normal MCA = 62+\-12 ACA = 50+\-12 PCA = 42+\-23    4/14 09/13/2020 2:14 PM

## 2020-09-13 NOTE — Progress Notes (Signed)
Occupational Therapy Treatment Patient Details Name: Lori Pierce MRN: [id number] DOB: May 13, 1976 Today's Date: 09/13/2020    History of present illness Pt is a 45 y.o. F admitted 3/6 with WHOL. 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 Significant PMH: asthma, allergies.  Pt demonstrates ability to make change for 13 dollars and name 19 animals in 1 minute. Pt continues to fail medication management even after a second attempt at the same task with > 3 errors. Pt omitted a complete medication. Pt with disorganized method to place medications in the pill box by sitting them in the bed to table top surface. Pt picking up the same medication without awareness. Pt is high risk for medication management errors on d/c.    OT comments  Functional cognition further assessed with The Pillbox Test: A Measure of Executive Functioning and Estimate of Medication Management. A fail designation is determined by 3 or more errors of omission or misplacement on the task. The pt completed the test with greater than 3 errors. See cognition section below.    Follow Up Recommendations  Outpatient OT;Supervision - Intermittent    Equipment Recommendations  Tub/shower seat    Recommendations for Other Services Speech consult    Precautions / Restrictions Precautions Precautions: Fall       Mobility Bed Mobility Overal bed mobility: Independent                  Transfers                 General transfer comment: declined    Balance                                           ADL either performed or assessed with clinical judgement   ADL Overall ADL's : Needs assistance/impaired                                       General ADL Comments: pt neglected to do 21 pills for the yellow pill bottle all together. pt nearly applied  the 1 x a day pill again but problem solved by checking for the color in the container. the task took 10 minutes to complete with omitting one whole medication.     Vision   Additional Comments: light sensitive. insistenting on minimal light in the room. OT requesting PT bring sunglasses from OT supplies for patient   Perception     Praxis      Cognition Arousal/Alertness: Awake/alert Behavior During Therapy: [facility] for tasks assessed/performed Overall Cognitive Status: Impaired/Different from baseline                                 General Comments: pt talking aloud to help  problem solve. it says "breakfast which is in the morning" Pt states "i am going to go slow and take my time so i dont mess up"        Exercises     Shoulder Instructions       General Comments      Pertinent Vitals/ Pain       Pain Assessment: 0-10 Faces Pain Scale:  Hurts even more Pain Location: headache Pain Descriptors / Indicators: Headache Pain Intervention(s): Monitored during session;Other (comment) (dark environment)  Home Living                                          Prior Functioning/Environment              Frequency  Min 2X/week        Progress Toward Goals  OT Goals(current goals can now be found in the care plan section)  Progress towards OT goals: Progressing toward goals  Acute Rehab OT Goals Patient Stated Goal: none stated OT Goal Formulation: With patient Time For Goal Achievement: 09/21/20 Potential to Achieve Goals: Good ADL Goals Pt Will Perform Grooming: Independently;standing Additional ADL Goal #1: Pt will perform simple IADL task independently Additional ADL Goal #2: Pt will perform four part trail making task independently  Plan Discharge plan remains appropriate    Co-evaluation                 AM-PAC OT "6 Clicks" Daily Activity     Outcome Measure   Help from another person eating meals?: A Little Help  from another person taking care of personal grooming?: A Little Help from another person toileting, which includes using toliet, bedpan, or urinal?: A Little Help from another person bathing (including washing, rinsing, drying)?: A Little Help from another person to put on and taking off regular upper body clothing?: A Little Help from another person to put on and taking off regular lower body clothing?: A Little 6 Click Score: 18    End of Session    OT Visit Diagnosis: Unsteadiness on feet (R26.81);Other abnormalities of gait and mobility (R26.89);Muscle weakness (generalized) (M62.81);Pain   Activity Tolerance Patient tolerated treatment well;Other (comment)   Patient Left in bed;with call bell/phone within reach;with chair alarm set;with family/visitor present   Nurse Communication Mobility status        Time: 1445-1500 OT Time Calculation (min): 15 min  Charges: OT General Charges $OT Visit: 1 Visit OT Treatments $Cognitive Funtion inital: Initial 15 mins   Brynn, OTR/L  Acute Rehabilitation Services Pager: [phone] Office: [phone] .    Mateo Flow 09/13/2020, 4:04 PM

## 2020-09-13 NOTE — Progress Notes (Signed)
NAMEAhlivia Pierce, MRN:  [id number], DOB:  [birth year], LOS: 8 ADMISSION DATE:  09/05/2020, CONSULTATION DATE:  09/09/2020 REFERRING MD:  Dr. 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 On 3/10 She developed neuros changes including LUE weakness, due to concern for vasospasm PCCM was consulted for help with management Past Medical History:  Asthma   Significant Hospital Events:  3/6 Admitted  3/7 coil embolization 3/10 CCM consult for suspected vasospasm 3/11 UDS positive for cocaine 3/14 started back on NS, continued on NE and nimotop. 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 3/11 PICC line  Significant Diagnostic Tests:  CT angio head 3/6 > 1. 5 mm right supraclinoid ICA aneurysm, presumably the symptomatic lesion.  2 mm right supraclinoid ICA outpouching which could be infundibulum or aneurysm, attention on catheter angiography.  IR angiogram 3/7 >1. Successful coil embolization of a right posterior communicating artery aneurysm without local thrombotic or distal embolic complication noted.   Micro Data:  MRSA PCR 3/6 > Negative   Antimicrobials:     Interim History / Subjective:   Overnight had lower SBPs (140-160) without change in neuro exam. No additional pressors or fluids added  This morning, c/o HA, back pain, urethral pain   Objective   Blood pressure (!) 178/97, pulse 65, temperature 98.9 F (37.2 C), temperature source Oral, resp. rate 18, height 5\' 4"  (1.626 m), weight 54.5 kg, last menstrual period 09/03/2020, SpO2 98 %.        Intake/Output Summary (Last 24 hours) at 09/13/2020 0848 Last data filed at 09/13/2020 0600 Gross per 24 hour  Intake 1033.78 ml  Output  2100 ml  Net -1066.22 ml   Filed Weights   09/06/20 1021  Weight: 54.5 kg    Examination: General: Middle aged F, ill appearing, supine in bed HEENT: NCAT pink tacky mm. Anicteric sclera. Neck pain, stiffness. Neuro: AAOx3 following commands. Fluent speech. Moving BUE BLE  CV:  rrr s1s2 cap refill brisk 2+ radial pulses PULM:  CTAb. 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 Could consider adding muscle relaxer if rebound HA from this is significant  -d/w CCM MD -- adding decadron 4mg  q6hr IV for possible chemical meningitis (will also add SSI, continue PPI in this setting)  Hyponatremia, mild, stable Hypokalemia Hypomagnesemia P -BMP pending, will add on mag. Correct as needed   Low back pain P -add lido patch -PT  Substance use disorder -UDS + for cocaine on 3/11  P -TOC consult  -cessation counseling provided   L/T/D - RUE PICC (placed 3/10)- continue, requiring pressors  -Foley- dc 3/14  Best practice (evaluated daily)  Diet: Regular diet Pain/Anxiety/Delirium protocol (if indicated): As needed  VAP protocol (  if indicated): N/A DVT prophylaxis: SCD GI prophylaxis: PPI Glucose control: SSI Mobility: As tolerated Disposition:ICU   Goals of Care:  Per primary   Labs   CBC: Recent Labs  Lab 09/09/20 1204  WBC 7.9  HGB 10.4*  HCT 30.6*  MCV 82.3  PLT 571*    Basic Metabolic Panel: Recent Labs  Lab 09/09/20 1204 09/11/20 0500 09/12/20 0937  NA 133* 134* 133*  K 3.4* 3.2* 3.5   CL 103 100 103  CO2 24 24 23   GLUCOSE 91 161* 120*  BUN <5* 5* 6  CREATININE 0.67 0.75 0.66  CALCIUM 8.4* 8.4* 8.2*  MG 1.6* 2.1  --   PHOS 3.1  --   --    GFR: Estimated Creatinine Clearance: 78.8 mL/min (by C-G formula based on SCr of 0.66 mg/dL). Recent Labs  Lab 09/09/20 1204  WBC 7.9    Liver Function Tests: Recent Labs  Lab 09/09/20 1204  AST 16  ALT 13  ALKPHOS 29*  BILITOT 0.3  PROT 6.0*  ALBUMIN 3.0*   No results for input(s): LIPASE, AMYLASE in the last 168 hours. No results for input(s): AMMONIA in the last 168 hours.  ABG No results found for: PHART, PCO2ART, PO2ART, HCO3, TCO2, ACIDBASEDEF, O2SAT   Coagulation Profile: No results for input(s): INR, PROTIME in the last 168 hours.  Cardiac Enzymes: No results for input(s): CKTOTAL, CKMB, CKMBINDEX, TROPONINI in the last 168 hours.  HbA1C: No results found for: HGBA1C  CBG: No results for input(s): GLUCAP in the last 168 hours.   Critical care time:    CRITICAL CARE Performed by: 11/09/20   Total critical care time: 40 minutes  Critical care time was exclusive of separately billable procedures and treating other patients. Critical care was necessary to treat or prevent imminent or life-threatening deterioration.  Critical care was time spent personally by me on the following activities: development of treatment plan with patient and/or surrogate as well as nursing, discussions with consultants, evaluation of patient's response to treatment, examination of patient, obtaining history from patient or surrogate, ordering and performing treatments and interventions, ordering and review of laboratory studies, ordering and review of radiographic studies, pulse oximetry and re-evaluation of patient's condition.  Lanier Clam MSN, AGACNP-BC [facility] Tessie Fass If no answer, [phone] 09/13/2020, 8:48 AM

## 2020-09-13 NOTE — Progress Notes (Signed)
Physical Therapy Treatment Patient Details Name: Lori Pierce MRN: [id number] DOB: July 11, 1975 Today's Date: 09/13/2020    History of Present Illness Pt is a 45 y.o. F admitted 3/6 with WHOL. 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 Significant PMH: asthma, allergies.    PT Comments    Pt supine upon arrival to room, requesting chips and a bath prior to mobility. Pt requiring close guard to min assist for mobility today, pt limited by poor standing balance and poor safety awareness when mobilizing. PT administered sunglasses to pt for hallway ambulation, per pt request given photophobia. Pt ambulatory for ~150 ft in hallway, with use of IV pole and HHA. x1 significant LOB requiring PT assist to correct. PT to continue to follow acutely, pt remains set on d/c home with assist from family/friends.    Follow Up Recommendations  Outpatient PT;Supervision for mobility/OOB     Equipment Recommendations  None recommended by PT    Recommendations for Other Services       Precautions / Restrictions Precautions Precautions: Fall Precaution Comments: decreased attention to L-side, decreased safety awareness Restrictions Weight Bearing Restrictions: No Other Position/Activity Restrictions: SBP goal > 180    Mobility  Bed Mobility Overal bed mobility: Needs Assistance Bed Mobility: Supine to Sit;Sit to Supine     Supine to sit: Min guard;HOB elevated Sit to supine: Min assist   General bed mobility comments: min guard for safety for supine>sit with HOB elevated per pt request. Min assist to return to supine for LE lifting into bed    Transfers Overall transfer level: Needs assistance Equipment used: None Transfers: Sit to/from Stand Sit to Stand: Min assist         General transfer comment: min assist for steadying upon standing, pt immediately  reaching for environment to self-steady.  Ambulation/Gait Ambulation/Gait assistance: Min assist Gait Distance (Feet): 160 Feet Assistive device: IV Pole;1 person hand held assist Gait Pattern/deviations: Step-through pattern;Decreased stride length;Narrow base of support;Drifts right/left Gait velocity: decr   General Gait Details: Min assist to steady and correct LOB, as pt with lateral LOB and reaching for environment. Pt with periods of L inattention and bumping into objects with IV pole on the L, requires PT assist to navigate IV pole.   Stairs             Wheelchair Mobility    Modified Rankin (Stroke Patients Only) Modified Rankin (Stroke Patients Only) Pre-Morbid Rankin Score: No symptoms Modified Rankin: Moderately severe disability     Balance Overall balance assessment: Needs assistance Sitting-balance support: Feet supported Sitting balance-Leahy Scale: Good     Standing balance support: During functional activity;Bilateral upper extremity supported Standing balance-Leahy Scale: Poor Standing balance comment: general unsteadiness, requires UE support, + LOB                            Cognition Arousal/Alertness: Awake/alert Behavior During Therapy: WFL for tasks assessed/performed Overall Cognitive Status: Impaired/Different from baseline Area of Impairment: Following commands;Safety/judgement;Problem solving;Awareness                       Following Commands: Follows one step commands with increased time Safety/Judgement: Decreased awareness of safety;Decreased awareness of deficits Awareness: Emergent Problem Solving: Slow processing;Decreased initiation;Difficulty sequencing;Requires verbal cues General Comments: Pt requires increased time and repeated cuing to perform mobility tasks, very  poor safety awareness during mobility with L inattention and stumbling around until cued by PT      Exercises      General Comments  General comments (skin integrity, edema, etc.): Pt only agreeable to mobility OOB if she can get washed up, PT assisting with washup as needed      Pertinent Vitals/Pain Pain Assessment: 0-10 Pain Score: 5  Faces Pain Scale: Hurts even more Pain Location: head, back Pain Descriptors / Indicators: Aching;Sore;Headache Pain Intervention(s): Limited activity within patient's tolerance;Monitored during session;Premedicated before session;Repositioned    Home Living                      Prior Function            PT Goals (current goals can now be found in the care plan section) Acute Rehab PT Goals Patient Stated Goal: Return to home and work PT Goal Formulation: With patient Time For Goal Achievement: 09/21/20 Potential to Achieve Goals: Good Progress towards PT goals: Progressing toward goals    Frequency    Min 4X/week      PT Plan Current plan remains appropriate    Co-evaluation              AM-PAC PT "6 Clicks" Mobility   Outcome Measure  Help needed turning from your back to your side while in a flat bed without using bedrails?: A Little Help needed moving from lying on your back to sitting on the side of a flat bed without using bedrails?: A Little Help needed moving to and from a bed to a chair (including a wheelchair)?: A Little Help needed standing up from a chair using your arms (e.g., wheelchair or bedside chair)?: A Little Help needed to walk in hospital room?: A Little Help needed climbing 3-5 steps with a railing? : A Little 6 Click Score: 18    End of Session   Activity Tolerance: Patient limited by fatigue;Patient limited by pain Patient left: in bed;with call bell/phone within reach;with bed alarm set;with family/visitor present Nurse Communication: Mobility status PT Visit Diagnosis: Unsteadiness on feet (R26.81)     Time: 7829-5621 PT Time Calculation (min) (ACUTE ONLY): 26 min  Charges:  $Gait Training: 8-22  mins $Therapeutic Activity: 8-22 mins                     Marye Round, PT Acute Rehabilitation Services Pager [phone]  Office [phone]   Tyrone Apple E Christain [place] 09/13/2020, 5:30 PM

## 2020-09-13 NOTE — TOC CAGE-AID Note (Signed)
Transition of Care [facility]) - CAGE-AID Screening   Patient Details  Name: Lori Pierce MRN: [id number] Date of Birth: [birth year]  [facility] CM/SW Contact:    Mearl Latin, LCSW Phone Number: 09/13/2020, 12:03 PM   Clinical Narrative: CSW spoke with patient. She denied ETOH or susbtance use and declined resources (though drug screen is positive). She confirmed services through the [place] and is accepting of outpatient PT referral once she is discharged. No other needs identified.    CAGE-AID Screening:    Have You Ever Felt You Ought to Cut Down on Your Drinking or Drug Use?: No Have People Annoyed You By Critizing Your Drinking Or Drug Use?: No Have You Felt Bad Or Guilty About Your Drinking Or Drug Use?: No Have You Ever Had a Drink or Used Drugs First Thing In The Morning to Steady Your Nerves or to Get Rid of a Hangover?: No CAGE-AID Score: 0  Substance Abuse Education Offered: Yes

## 2020-09-13 NOTE — Progress Notes (Signed)
Pharmacy Electrolyte Replacement  Recent Labs:  Recent Labs    09/11/20 0500 09/12/20 0937 09/13/20 1325  K 3.2*   < > 3.3*  MG 2.1  --   --   CREATININE 0.75   < > 0.60   < > = values in this interval not displayed.    Low Critical Values (K </= 2.5, Phos </= 1, Mg </= 1) Present: None  MD Contacted: n/a  Plan: KCl po q4h x2 doses  Repeat BMP in AM

## 2020-09-13 NOTE — Progress Notes (Signed)
  NEUROSURGERY PROGRESS NOTE   TCDs reviewed. Elevated vevlocities of R and L MCA. Remains neuro intact per nursing.  Date POD PCO2 HCT BP  MCA ACA PCA OPHT SIPH VERT Basilar  3/8 MR     Right  Left   102  89   -36  -26   -43  31   30  18    68  41   -30  -27   -43      3/9 MR     Right  Left   168  119   -44  -53   49  42   23  18   48  52   -23  -32   -40      3/11 RH     Right  Left   124  96   22  48   36  53   30  28   35  31   39  *         3/14 MR      Right  Left   119  144   -28  *   47  51   25  27   53  97   -32  -31   -41       IVF restarted by CCM. SBP now approx 180. Continue HHH therapy for the next several days prior to tapering off.

## 2020-09-13 NOTE — Progress Notes (Signed)
  NEUROSURGERY PROGRESS NOTE   No issues overnight. HA manageable LUE symptoms resolved  EXAM:  BP (!) 154/110   Pulse 90   Temp 99.5 F (37.5 C) (Oral)   Resp 18   Ht 5\' 4"  (1.626 m)   Wt 54.5 kg   LMP 09/03/2020   SpO2 98%   BMI 20.62 kg/m   Awake, alert, oriented  Speech fluent, appropriate  CN grossly intact  5/5 RUE/BUE Minimal left drift  SBP 150's  IMPRESSION/PLAN 45 y.o. female SAH d#8s/p coiling RPcom aneurysm. Neurologically intact.  Cerebral vasospasm - Symptomatic vasospasm: Improving. Remains neurologically intact with SBPs 150's. TCDs from Friday show down trending MCA velocities. Repeat TCDs today.  - continue nimotop, keppra, supportive care - continue PT/OT - Appreciate CCM Assistance with management of patient

## 2020-09-13 NOTE — Progress Notes (Signed)
  Speech Language Pathology Treatment: Cognitive-Linquistic  Patient Details Name: Lori Pierce MRN: [id number] DOB: 10/05/1975 Today's Date: 09/13/2020 Time: 7622-6333 SLP Time Calculation (min) (ACUTE ONLY): 12 min  Assessment / Plan / Recommendation Clinical Impression  Pt seen for cognitive intervention and laying mostly supine eating cheese and crackers due to headache with sitting higher. She was agreeable for head to bed to be raised slightly. She recalled repeat "brain tests" today (had  Vasospasm). 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 She will need 24 hour supervision at discharge and continued ST as outpatient.    HPI HPI: Lori Pierce is a 45 y.o. female with history of asthma and allergies who presented to the ED with worst headache of life. 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 Underwent successful coil embolization of RCA aneurysm 3/7.      SLP Plan  Continue with current plan of care       Recommendations                   Oral Care Recommendations: Oral care BID Follow up Recommendations: Outpatient SLP SLP Visit Diagnosis: Cognitive communication deficit (L45.625) Plan: Continue with current plan of care                       Royce Macadamia 09/13/2020, 10:54 AM  Breck Coons Lonell Face.Ed Nurse, children's [phone] Office [phone]

## 2020-09-14 ENCOUNTER — Inpatient Hospital Stay (HOSPITAL_COMMUNITY): Payer: No Typology Code available for payment source

## 2020-09-14 DIAGNOSIS — I428 Other cardiomyopathies: Secondary | ICD-10-CM | POA: Diagnosis not present

## 2020-09-14 DIAGNOSIS — E440 Moderate protein-calorie malnutrition: Secondary | ICD-10-CM | POA: Insufficient documentation

## 2020-09-14 LAB — ECHOCARDIOGRAM COMPLETE
Area-P 1/2: 3.31 cm2
Height: 64 in
S' Lateral: 2.8 cm
Weight: 1922.41 oz

## 2020-09-14 LAB — BASIC METABOLIC PANEL
Anion gap: 10 (ref 5–15)
BUN: 9 mg/dL (ref 6–20)
CO2: 22 mmol/L (ref 22–32)
Calcium: 9.7 mg/dL (ref 8.9–10.3)
Chloride: 101 mmol/L (ref 98–111)
Creatinine, Ser: 0.59 mg/dL (ref 0.44–1.00)
GFR, Estimated: >60 mL/min (ref >60)
Glucose, Bld: 146 mg/dL — ABNORMAL HIGH (ref 70–99)
Potassium: 4.7 mmol/L (ref 3.5–5.1)
Sodium: 133 mmol/L — ABNORMAL LOW (ref 135–145)

## 2020-09-14 LAB — GLUCOSE, CAPILLARY
Glucose-Capillary: 149 mg/dL — ABNORMAL HIGH (ref 70–99)
Glucose-Capillary: 161 mg/dL — ABNORMAL HIGH (ref 70–99)
Glucose-Capillary: 162 mg/dL — ABNORMAL HIGH (ref 70–99)
Glucose-Capillary: 163 mg/dL — ABNORMAL HIGH (ref 70–99)
Glucose-Capillary: 177 mg/dL — ABNORMAL HIGH (ref 70–99)
Glucose-Capillary: 195 mg/dL — ABNORMAL HIGH (ref 70–99)
Glucose-Capillary: 211 mg/dL — ABNORMAL HIGH (ref 70–99)

## 2020-09-14 LAB — MAGNESIUM: Magnesium: 1.9 mg/dL (ref 1.7–2.4)

## 2020-09-14 MED ORDER — LIDOCAINE 5 % EX PTCH
2.0000 | MEDICATED_PATCH | CUTANEOUS | Status: DC
  Administered 2020-09-15 – 2020-09-17 (×2): 1 via TRANSDERMAL
  Administered 2020-09-18: 2 via TRANSDERMAL
  Filled 2020-09-14 (×6): qty 2

## 2020-09-14 MED ORDER — MAGNESIUM SULFATE IN D5W 1-5 GM/100ML-% IV SOLN
1.0000 g | Freq: Once | INTRAVENOUS | Status: AC
  Administered 2020-09-14: 1 g via INTRAVENOUS
  Filled 2020-09-14: qty 100

## 2020-09-14 MED ORDER — NIMODIPINE 30 MG PO CAPS
30.0000 mg | ORAL_CAPSULE | ORAL | Status: DC
  Administered 2020-09-14 – 2020-09-20 (×74): 30 mg via ORAL
  Filled 2020-09-14 (×62): qty 1

## 2020-09-14 MED ORDER — NIMODIPINE 6 MG/ML PO SOLN: 30.0000 mg | ORAL | Status: DC

## 2020-09-14 MED ORDER — VASOPRESSIN 20 UNITS/100 ML INFUSION FOR SHOCK
0.0000 [IU]/min | INTRAVENOUS | Status: DC
  Administered 2020-09-14: 0.04 [IU]/min via INTRAVENOUS
  Filled 2020-09-14 (×2): qty 100

## 2020-09-14 NOTE — Progress Notes (Signed)
Physical Therapy Treatment Patient Details Name: Lori Pierce MRN: [id number] DOB: February 02, 1976 Today's Date: 09/14/2020    History of Present Illness Pt is a 45 y.o. F admitted 3/6 with WHOL. 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 Fall on 3/15 with accompanying dysarthria and hypotension. Significant PMH: asthma, allergies.    PT Comments    Pt wanting to mobilize in hallway today, PT encouraging pt to use RW for stability. Pt continues to require min assist for mobility tasks, especially steadying and hallway navigation. Pt demonstrates poor balance and intermittent LE buckling in standing, pt with poor awareness and correction of this. Pt with new SBP goal of 160-180, SBP during session ranging from 128-148. PT suspects LBP is related to increased sedentary behavior over hospitalization, will try extension based exercise for LBP as pt felt relief with mild spinal extension and pt reports increased s/s (back pain, radicular symptoms to buttocks) with flexion. PT to continue to follow acutely.    Follow Up Recommendations  Outpatient PT;Supervision for mobility/OOB     Equipment Recommendations  None recommended by PT    Recommendations for Other Services       Precautions / Restrictions Precautions Precautions: Fall Precaution Comments: decreased attention to L-side, decreased safety awareness Restrictions Other Position/Activity Restrictions: SBP goal > 160-180    Mobility  Bed Mobility Overal bed mobility: Needs Assistance Bed Mobility: Supine to Sit;Sit to Supine     Supine to sit: Supervision Sit to supine: Supervision   General bed mobility comments: for safety, increased time especially to bring LEs into bed upon return to supine.    Transfers Overall transfer level: Needs assistance Equipment used: Rolling walker (2 wheeled) Transfers:  Sit to/from Stand Sit to Stand: Min assist         General transfer comment: min assist for rise and steady  Ambulation/Gait Ambulation/Gait assistance: Min assist Gait Distance (Feet): 160 Feet Assistive device: Rolling walker (2 wheeled) Gait Pattern/deviations: Step-through pattern;Decreased stride length;Narrow base of support;Drifts right/left Gait velocity: decr   General Gait Details: Min assist to steady, guide RW. PT requiring frequent visual and verbal cuing for proper placement in RW, staying away from objects on L as pt continues to have L inattention.   Stairs             Wheelchair Mobility    Modified Rankin (Stroke Patients Only) Modified Rankin (Stroke Patients Only) Pre-Morbid Rankin Score: No symptoms Modified Rankin: Moderately severe disability     Balance Overall balance assessment: Needs assistance Sitting-balance support: Feet supported Sitting balance-Leahy Scale: Good     Standing balance support: During functional activity;Bilateral upper extremity supported Standing balance-Leahy Scale: Poor Standing balance comment: general unsteadiness, requires UE support, + LOB                            Cognition Arousal/Alertness: Awake/alert Behavior During Therapy: WFL for tasks assessed/performed Overall Cognitive Status: Impaired/Different from baseline Area of Impairment: Following commands;Safety/judgement;Problem solving;Awareness;Attention                   Current Attention Level: Sustained   Following Commands: Follows one step commands with increased time Safety/Judgement: Decreased awareness of safety;Decreased awareness of deficits Awareness: Emergent Problem Solving: Slow processing;Decreased initiation;Difficulty sequencing;Requires verbal cues General Comments: Pt lacking insight into mobility deficits and does not correct unsteadiness. Pt moves impulsively  and quickly, even though she has very impaired  balance      Exercises Low Level/ICU Exercises Stabilized Bridging: AROM;Both;5 reps;Supine (very limited ROM, pt promote lumbar extension as lumbar flexion increases back pain)    General Comments        Pertinent Vitals/Pain Pain Assessment: Faces Faces Pain Scale: Hurts even more Pain Location: back Pain Descriptors / Indicators: Aching;Sore;Headache Pain Intervention(s): Limited activity within patient's tolerance;Monitored during session;Repositioned    Home Living                      Prior Function            PT Goals (current goals can now be found in the care plan section) Acute Rehab PT Goals Patient Stated Goal: Return to home and work PT Goal Formulation: With patient Time For Goal Achievement: 09/21/20 Potential to Achieve Goals: Good Progress towards PT goals: Progressing toward goals    Frequency    Min 4X/week      PT Plan Current plan remains appropriate    Co-evaluation              AM-PAC PT "6 Clicks" Mobility   Outcome Measure  Help needed turning from your back to your side while in a flat bed without using bedrails?: A Little Help needed moving from lying on your back to sitting on the side of a flat bed without using bedrails?: A Little Help needed moving to and from a bed to a chair (including a wheelchair)?: A Little Help needed standing up from a chair using your arms (e.g., wheelchair or bedside chair)?: A Little Help needed to walk in hospital room?: A Little Help needed climbing 3-5 steps with a railing? : A Little 6 Click Score: 18    End of Session Equipment Utilized During Treatment: Gait belt Activity Tolerance: Patient limited by fatigue;Patient limited by pain Patient left: in bed;with call bell/phone within reach;with bed alarm set Nurse Communication: Mobility status PT Visit Diagnosis: Unsteadiness on feet (R26.81)     Time: 0240-9735 PT Time Calculation (min) (ACUTE ONLY): 20 min  Charges:  $Gait  Training: 8-22 mins                     Marye Round, PT Acute Rehabilitation Services Pager [phone]  Office [phone]     Truddie Coco 09/14/2020, 5:16 PM

## 2020-09-14 NOTE — Progress Notes (Signed)
NAMEKavya Pierce, MRN:  [id number], DOB:  [birth year], LOS: 9 ADMISSION DATE:  09/05/2020, CONSULTATION DATE:  09/09/2020 REFERRING MD:  Dr. 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 On 3/10 She developed neuros changes including LUE weakness, due to concern for vasospasm PCCM was consulted for help with management Past Medical History:  Asthma   Significant Hospital Events:  3/6 Admitted  3/7 coil embolization 3/10 CCM consult for suspected vasospasm 3/11 UDS positive for cocaine 3/14 started back on NS, continued on NE and nimotop. 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 3/11 PICC line  Significant Diagnostic Tests:  CT angio head 3/6 > 1. 5 mm right supraclinoid ICA aneurysm, presumably the symptomatic lesion.  2 mm right supraclinoid ICA outpouching which could be infundibulum or aneurysm, attention on catheter angiography.  IR angiogram 3/7 >1. Successful coil embolization of a right posterior communicating artery aneurysm without local thrombotic or distal embolic complication noted.   Micro Data:  MRSA PCR 3/6 > Negative   Antimicrobials:     Interim History / Subjective:   Yesterday afternoon TCDs w elevated R and L MCA velocities   NAEO   Objective   Blood pressure (!) 150/88, pulse 87, temperature 99.3 F (37.4 C), temperature source Oral, resp. rate (!) 24, height 5\' 4"  (1.626 m), weight 54.5 kg, last menstrual period 09/03/2020, SpO2 (!) 88 %.        Intake/Output Summary (Last 24 hours) at 09/14/2020 0719 Last data filed at 09/14/2020 0600 Gross per 24 hour  Intake 2840 ml  Output 1300 ml  Net 1540 ml   Filed Weights   09/06/20 1021  Weight: 54.5 kg     Examination: General: Middle aged F reclined in bed, tearful NAD  HEENT: NCAT anicteric sclera pink mmm Neuro: AAOx4 following commands PERRL symmetrical BUE BLE strength CV:  rrr s1s2 no regm bounding periph pulses  PULM:  CTAb, symmetrical chest expansion, no accessory use  GI: soft round ndnt  Extremities: No acute deformity, no cyanosis or clubbing. 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 P -nimotop -- changing to 30mg  q2hr  -IVF, NE-- SBP goal 160-180 -q1hr neuro checks -TCD per NSGY -d/w CCM MD -- ECHO to be ordered today to assess cardiac function given high pressor need   HA, improving P -decadron -PRN fioricet  Hyponatremia, mild  P -Cont to trend -will give mag for goal 2    Low back pain P -lido patch -PT, mobility   Substance use disorder  -UDS + for cocaine on 3/11  P -TOC consult  -cessation counseling provided   L/T/D - RUE PICC (placed 3/10)- continue, requiring pressors   Best practice (evaluated daily)  Diet: Regular diet Pain/Anxiety/Delirium protocol (if indicated): As needed  VAP protocol (if indicated): N/A DVT prophylaxis: SCD GI prophylaxis: PPI Glucose control: SSI Mobility: PT, OOB Disposition:ICU   Goals of Care:  Per primary   Labs   CBC: Recent Labs  Lab 09/09/20 1204  WBC 7.9  HGB 10.4*  HCT 30.6*  MCV 82.3  PLT 571*    Basic  Metabolic Panel: Recent Labs  Lab 09/09/20 1204 09/11/20 0500 09/12/20 0937 09/13/20 1325 09/14/20 0544  NA 133* 134* 133* 138 133*  K 3.4* 3.2* 3.5 3.3* 4.7  CL 103 100 103 106 101  CO2 24 24 23 23 22   GLUCOSE 91 161* 120* 147* 146*  BUN <5* 5* 6 9 9   CREATININE 0.67 0.75 0.66 0.60 0.59  CALCIUM 8.4* 8.4* 8.2* 8.8* 9.7  MG 1.6* 2.1  --  1.8 1.9  PHOS 3.1  --   --   --   --    GFR: Estimated Creatinine Clearance: 78.8 mL/min (by C-G formula based on SCr  of 0.59 mg/dL). Recent Labs  Lab 09/09/20 1204  WBC 7.9    Liver Function Tests: Recent Labs  Lab 09/09/20 1204  AST 16  ALT 13  ALKPHOS 29*  BILITOT 0.3  PROT 6.0*  ALBUMIN 3.0*   No results for input(s): LIPASE, AMYLASE in the last 168 hours. No results for input(s): AMMONIA in the last 168 hours.  ABG No results found for: PHART, PCO2ART, PO2ART, HCO3, TCO2, ACIDBASEDEF, O2SAT   Coagulation Profile: No results for input(s): INR, PROTIME in the last 168 hours.  Cardiac Enzymes: No results for input(s): CKTOTAL, CKMB, CKMBINDEX, TROPONINI in the last 168 hours.  HbA1C: Hgb A1c MFr Bld  Date/Time Value Ref Range Status  09/13/2020 01:25 PM 5.4 4.8 - 5.6 % Final    Comment:    (NOTE) Pre diabetes:          5.7%-6.4%  Diabetes:              >6.4%  Glycemic control for   <7.0% adults with diabetes     CBG: Recent Labs  Lab 09/13/20 1307 09/13/20 1540 09/13/20 1954 09/13/20 2354 09/14/20 0345  GLUCAP 146* 165* 190* 177* 195*     Critical care time:    CRITICAL CARE Performed by: 09/15/20   Total critical care time: 40 minutes  Critical care time was exclusive of separately billable procedures and treating other patients.  Critical care was necessary to treat or prevent imminent or life-threatening deterioration.  Critical care was time spent personally by me on the following activities: development of treatment plan with patient and/or surrogate as well as nursing, discussions with consultants, evaluation of patient's response to treatment, examination of patient, obtaining history from patient or surrogate, ordering and performing treatments and interventions, ordering and review of laboratory studies, ordering and review of radiographic studies, pulse oximetry and re-evaluation of patient's condition.  09/16/20 MSN, AGACNP-BC [facility] [place] [facility] If no answer, Tessie Fass 09/14/2020, 7:19 AM

## 2020-09-14 NOTE — Progress Notes (Signed)
  Echocardiogram 2D Echocardiogram has been performed. Notified Dr. Royann Shivers 12:43 09/14/20.  Lori Pierce 09/14/2020, 12:44 PM

## 2020-09-14 NOTE — Progress Notes (Signed)
Neurosurgery Service Progress Note  Subjective: No acute events overnight, got up to walk with suspected drop in BP and had dysarthria then fell  Objective: Vitals:   09/14/20 1130 09/14/20 1200 09/14/20 1300 09/14/20 1400  BP: 138/88 137/77 (!) 157/120 (!) 168/105  Pulse: 68 70 60 71  Resp: (!) 22 20 (!) 24 17  Temp:  98.4 F (36.9 C)    TempSrc:  Oral    SpO2: 96% (!) 89% 97% 97%  Weight:      Height:        Physical Exam: AOx3, PERRL, EOMI, FS, TM, Strength 5/5 x4, SILTx4, no drift, speech fluent with normal content R groin site c/d/i no obvious hematoma, good distal pulses  Assessment & Plan: 45 y.o. right handed but partially ambidextrous woman w/ aSAH s/p coiling R PComm aneurysm, day #9, some clinical spasm, still pressure-dependent but max'd on one pressor.  -agree w/ getting an echo given dec'd response to NE, would not add a second pressor unless she becomes symptomatic given that she's young and female - high risk for Takatsubo's -goal SBP>160 but if asymptomatic then keep BP as elevated as one pressor will allow, TCDs yesterday still elevated, proved today that she is likely pressure-dependent  Lori Pierce  09/14/20 2:46 PM

## 2020-09-15 ENCOUNTER — Inpatient Hospital Stay (HOSPITAL_COMMUNITY): Payer: No Typology Code available for payment source

## 2020-09-15 DIAGNOSIS — I609 Nontraumatic subarachnoid hemorrhage, unspecified: Secondary | ICD-10-CM

## 2020-09-15 LAB — GLUCOSE, CAPILLARY
Glucose-Capillary: 173 mg/dL — ABNORMAL HIGH (ref 70–99)
Glucose-Capillary: 176 mg/dL — ABNORMAL HIGH (ref 70–99)
Glucose-Capillary: 159 mg/dL — ABNORMAL HIGH (ref 70–99)
Glucose-Capillary: 169 mg/dL — ABNORMAL HIGH (ref 70–99)
Glucose-Capillary: 138 mg/dL — ABNORMAL HIGH (ref 70–99)
Glucose-Capillary: 134 mg/dL — ABNORMAL HIGH (ref 70–99)

## 2020-09-15 NOTE — Progress Notes (Addendum)
Physical Therapy Treatment Patient Details Name: Lori Pierce MRN: [id number] DOB: January 10, 1976 Today's Date: 09/15/2020    History of Present Illness Pt is a 45 y.o. F admitted 3/6 with WHOL. 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 Fall on 3/15 with accompanying dysarthria and hypotension. Significant PMH: asthma, allergies.    PT Comments    Pt with good progress towards her physical therapy goals today; eager and motivated to participate in session, stating, "I'm walking to the snack machine today." Pt ambulating x 600 feet with a walker and up to min assist provided for balance. Continues with left inattention and subsequent drift towards the left and difficulty with obstacle negotiation. Cues provided to maintain straight path and promoted visual scanning.  BP remaining closer to goal parameters today: Pre mobility 165/95, Post mobility: 158/97.Will continue to progress as tolerated.     Follow Up Recommendations  Outpatient PT;Supervision for mobility/OOB     Equipment Recommendations  Rolling walker with 5" wheels    Recommendations for Other Services       Precautions / Restrictions Precautions Precautions: Fall Precaution Comments: L inattention Restrictions Weight Bearing Restrictions: No Other Position/Activity Restrictions: SBP goal > 160-180    Mobility  Bed Mobility Overal bed mobility: Modified Independent                  Transfers Overall transfer level: Needs assistance Equipment used: Rolling walker (2 wheeled) Transfers: Sit to/from Stand Sit to Stand: Supervision            Ambulation/Gait Ambulation/Gait assistance: Min assist Gait Distance (Feet): 600 Feet Assistive device: Rolling walker (2 wheeled) Gait Pattern/deviations: Step-through pattern;Decreased stride length;Narrow base of support;Drifts right/left      General Gait Details: Intermittent minA for balance, mod cues for maintaining walker on a straight path, walker proximity, scanning environment for obstacles (towards left side).   Stairs             Wheelchair Mobility    Modified Rankin (Stroke Patients Only) Modified Rankin (Stroke Patients Only) Pre-Morbid Rankin Score: No symptoms Modified Rankin: Moderately severe disability     Balance Overall balance assessment: Needs assistance Sitting-balance support: Feet supported Sitting balance-Leahy Scale: Good     Standing balance support: Bilateral upper extremity supported;During functional activity Standing balance-Leahy Scale: Poor Standing balance comment: requires UE support                            Cognition Arousal/Alertness: Awake/alert Behavior During Therapy: WFL for tasks assessed/performed Overall Cognitive Status: Impaired/Different from baseline Area of Impairment: Safety/judgement;Problem solving;Awareness;Attention                   Current Attention Level: Selective     Safety/Judgement: Decreased awareness of safety;Decreased awareness of deficits Awareness: Emergent   General Comments: Requires cues for safety awareness with environmental negotiation      Exercises      General Comments        Pertinent Vitals/Pain Pain Assessment: Faces Faces Pain Scale: No hurt    Home Living                      Prior Function            PT Goals (current goals can now be found in the care plan section) Acute Rehab PT Goals  Patient Stated Goal: Return to home and work Potential to Achieve Goals: Good Progress towards PT goals: Progressing toward goals    Frequency    Min 4X/week      PT Plan Equipment recommendations need to be updated    Co-evaluation              AM-PAC PT "6 Clicks" Mobility   Outcome Measure  Help needed turning from your back to your side while in a flat bed without  using bedrails?: None Help needed moving from lying on your back to sitting on the side of a flat bed without using bedrails?: None Help needed moving to and from a bed to a chair (including a wheelchair)?: A Little Help needed standing up from a chair using your arms (e.g., wheelchair or bedside chair)?: None Help needed to walk in hospital room?: A Little Help needed climbing 3-5 steps with a railing? : A Little 6 Click Score: 21    End of Session Equipment Utilized During Treatment: Gait belt Activity Tolerance: Patient tolerated treatment well Patient left: in bed;with call bell/phone within reach;with bed alarm set Nurse Communication: Mobility status PT Visit Diagnosis: Unsteadiness on feet (R26.81)     Time: 9476-5465 PT Time Calculation (min) (ACUTE ONLY): 28 min  Charges:  $Gait Training: 8-22 mins $Therapeutic Activity: 8-22 mins                     Lillia Pauls, PT, DPT Acute Rehabilitation Services Pager [phone] Office [phone]    Norval Morton 09/15/2020, 9:47 AM

## 2020-09-15 NOTE — Progress Notes (Signed)
NAMEJamisen Pierce, MRN:  [id number], DOB:  [birth year], LOS: 10 ADMISSION DATE:  09/05/2020, CONSULTATION DATE:  09/09/2020 REFERRING MD:  Dr. 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 On 3/10 She developed neuros changes including LUE weakness, due to concern for vasospasm PCCM was consulted for help with management Past Medical History:  Asthma   Significant Hospital Events:  3/6 Admitted  3/7 coil embolization 3/10 CCM consult for suspected vasospasm 3/11 UDS positive for cocaine 3/14 started back on NS, continued on NE and nimotop. 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 3/11 PICC line  Significant Diagnostic Tests:  CT angio head 3/6 > 1. 5 mm right supraclinoid ICA aneurysm, presumably the symptomatic lesion.  2 mm right supraclinoid ICA outpouching which could be infundibulum or aneurysm, attention on catheter angiography.  IR angiogram 3/7 >1. Successful coil embolization of a right posterior communicating artery aneurysm without local thrombotic or distal embolic complication noted.   Micro Data:  MRSA PCR 3/6 > Negative   Antimicrobials:     Interim History / Subjective:   Remains on hemodynamic augmentation for sonographic vasospasm with some worsening exam with blood pressure drop yesterday. Presently no symptoms ambulating.  Denies headache.  Objective   Blood pressure (!) 172/94, pulse 73, temperature 99.4 F (37.4 C), temperature source Oral, resp. rate (!) 22, height 5\' 4"  (1.626 m), weight 54.5 kg, last menstrual period 09/03/2020, SpO2 99 %.        Intake/Output Summary (Last 24 hours) at 09/15/2020 1627 Last data filed at 09/15/2020 1500 Gross per 24 hour   Intake 3802.18 ml  Output --  Net 3802.18 ml   Filed Weights   09/06/20 1021  Weight: 54.5 kg    Examination: General: Middle aged man lying in bed HEENT: NCAT anicteric sclera pink mmm Neuro: AAOx4 following commands PERRL symmetrical BUE BLE strength CV:  rrr s1s2 no regm bounding periph pulses  PULM:  CTAb, symmetrical chest expansion, no accessory use  GI: soft round ndnt  Extremities: No acute deformity, no cyanosis or clubbing. 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 P -nimotop -- changing to 30mg  q2hr  -IVF, NE-- SBP goal 160-180 -Every 2 hour neurochecks  HA, improving P -decadron -PRN fioricet  Hyponatremia, mild  P -Cont to trend -will give mag for goal 2    Low back pain P -lido patch -PT, mobility   Substance use disorder  -UDS + for cocaine on 3/11  P -TOC consult  -cessation counseling provided   L/T/D - RUE PICC (placed 3/10)- continue, requiring pressors   Best practice (evaluated daily)  Diet: Regular diet Pain/Anxiety/Delirium protocol (if indicated): As needed  VAP protocol (if indicated): N/A DVT prophylaxis: SCD GI prophylaxis: PPI Glucose control: SSI Mobility: PT, OOB Disposition:ICU   Goals of Care:  Per primary   Labs   CBC: Recent Labs  Lab 09/09/20 1204  WBC 7.9  HGB 10.4*  HCT 30.6*  MCV 82.3  PLT 571*    Basic Metabolic Panel: Recent Labs  Lab 09/09/20 1204 09/11/20 0500 09/12/20 0937 09/13/20 1325 09/14/20 0544  NA 133* 134* 133* 138 133*  K 3.4* 3.2* 3.5 3.3* 4.7  CL 103 100 103 106 101  CO2 24 24 23 23 22   GLUCOSE 91 161* 120* 147* 146*  BUN <5* 5* 6 9 9   CREATININE 0.67 0.75 0.66 0.60 0.59  CALCIUM 8.4* 8.4* 8.2* 8.8* 9.7  MG 1.6* 2.1  --  1.8 1.9  PHOS 3.1  --   --   --   --    GFR: Estimated Creatinine Clearance: 78.8 mL/min (by C-G formula based on SCr of 0.59  mg/dL). Recent Labs  Lab 09/09/20 1204  WBC 7.9    Liver Function Tests: Recent Labs  Lab 09/09/20 1204  AST 16  ALT 13  ALKPHOS 29*  BILITOT 0.3  PROT 6.0*  ALBUMIN 3.0*   No results for input(s): LIPASE, AMYLASE in the last 168 hours. No results for input(s): AMMONIA in the last 168 hours.  ABG No results found for: PHART, PCO2ART, PO2ART, HCO3, TCO2, ACIDBASEDEF, O2SAT   Coagulation Profile: No results for input(s): INR, PROTIME in the last 168 hours.  Cardiac Enzymes: No results for input(s): CKTOTAL, CKMB, CKMBINDEX, TROPONINI in the last 168 hours.  HbA1C: Hgb A1c MFr Bld  Date/Time Value Ref Range Status  09/13/2020 01:25 PM 5.4 4.8 - 5.6 % Final    Comment:    (NOTE) Pre diabetes:          5.7%-6.4%  Diabetes:              >6.4%  Glycemic control for   <7.0% adults with diabetes     CBG: Recent Labs  Lab 09/14/20 2331 09/15/20 0336 09/15/20 0751 09/15/20 1154 09/15/20 1538  GLUCAP 161* 173* 176* 159* 169*     Critical care time:    CRITICAL CARE Performed by: 09/17/20   Total critical care time: 40 minutes  Critical care time was exclusive of separately billable procedures and treating other patients.  Critical care was necessary to treat or prevent imminent or life-threatening deterioration.  Critical care was time spent personally by me on the following activities: development of treatment plan with patient and/or surrogate as well as nursing, discussions with consultants, evaluation of patient's response to treatment, examination of patient, obtaining history from patient or surrogate, ordering and performing treatments and interventions, ordering and review of laboratory studies, ordering and review of radiographic studies, pulse oximetry and re-evaluation of patient's condition.  09/17/20, MD [facility] ICU Physician [facility]  Pager: [phone] Or Epic Secure Chat After hours: [phone].  09/15/2020,  4:29 PM

## 2020-09-15 NOTE — Progress Notes (Signed)
Neurosurgery Service Progress Note  Subjective: No acute events overnight, no new issues  Objective: Vitals:   09/15/20 0615 09/15/20 0630 09/15/20 0645 09/15/20 0700  BP: (!) 141/88 (!) 152/87 (!) 151/95 (!) 160/108  Pulse:      Resp: 20 14 18 14   Temp:      TempSrc:      SpO2:      Weight:      Height:        Physical Exam: AOx3, PERRL, EOMI, FS, TM, Strength 5/5 x4, SILTx4, no drift, speech fluent with normal content R groin site c/d/i no obvious hematoma, good distal pulses  Assessment & Plan: 45 y.o. right handed but partially ambidextrous woman w/ aSAH s/p coiling R PComm aneurysm, day #10, some clinical spasm, still pressure-dependent but max'd on one pressor. 3/15 echo WNL  -TCDs today, prelim numbers look like they continue to be elevated -goal SBP>160 but if asymptomatic then keep BP as elevated as one pressor will allow, will consider weaning pressors tomorrow or Friday if she continues to do well   Wednesday  09/15/20 7:38 AM

## 2020-09-15 NOTE — Progress Notes (Signed)
Transcranial Doppler  Date POD PCO2 HCT BP  MCA ACA PCA OPHT SIPH VERT Basilar  3/8 MR     Right  Left   102  89   -36  -26   -43  31   30  18    68  41   -30  -27   -43      3/9 MR     Right  Left   168  119   -44  -53   49  42   23  18   48  52   -23  -32   -40      3/11 RH     Right  Left   124  96   22  48   36  53   30  28   35  31   39  *         3/14 MR      Right  Left   119  144   -28  *   47  51   25  27   53  97   -32  -31   -41      3/16 GC      Right  Left   137  139   -32  -36   64  39   30  20   30  25    -35  -66   -52           Right  Left                                            Right  Left                                        MCA = Middle Cerebral Artery      OPHT = Opthalmic Artery     BASILAR = Basilar Artery   ACA = Anterior Cerebral Artery     SIPH = Carotid Siphon PCA = Posterior Cerebral Artery   VERT = Verterbral Artery                   Normal MCA = 62+\-12 ACA = 50+\-12 PCA = 42+\-23  * - Unable to insonate  09/15/20 11:46 AM RVT

## 2020-09-16 ENCOUNTER — Encounter (HOSPITAL_COMMUNITY): Payer: Self-pay | Admitting: Neurosurgery

## 2020-09-16 LAB — BASIC METABOLIC PANEL
Anion gap: 7 (ref 5–15)
BUN: 10 mg/dL (ref 6–20)
CO2: 23 mmol/L (ref 22–32)
Calcium: 8.4 mg/dL — ABNORMAL LOW (ref 8.9–10.3)
Chloride: 105 mmol/L (ref 98–111)
Creatinine, Ser: 0.57 mg/dL (ref 0.44–1.00)
GFR, Estimated: >60 mL/min (ref >60)
Glucose, Bld: 146 mg/dL — ABNORMAL HIGH (ref 70–99)
Potassium: 3.6 mmol/L (ref 3.5–5.1)
Sodium: 135 mmol/L (ref 135–145)

## 2020-09-16 LAB — GLUCOSE, CAPILLARY
Glucose-Capillary: 130 mg/dL — ABNORMAL HIGH (ref 70–99)
Glucose-Capillary: 154 mg/dL — ABNORMAL HIGH (ref 70–99)
Glucose-Capillary: 121 mg/dL — ABNORMAL HIGH (ref 70–99)

## 2020-09-16 MED ORDER — DEXAMETHASONE SODIUM PHOSPHATE 4 MG/ML IJ SOLN
4.0000 mg | INTRAMUSCULAR | Status: DC
  Administered 2020-09-16 – 2020-09-17 (×2): 4 mg via INTRAVENOUS
  Filled 2020-09-16 (×2): qty 1

## 2020-09-16 NOTE — Progress Notes (Signed)
   09/16/20 1125  Clinical Encounter Type  Visited With Patient  Visit Type Initial  Referral From Nurse  Consult/Referral To Chaplain  Spiritual Encounters  Spiritual Needs Emotional  Stress Factors  Patient Stress Factors Loss of control   Chaplain responded to consult for major life transition. Chaplain engaged active listening and provided emotional and spiritual support. Pt expressed she has a strong support system. Pt is accustomed to being independent and in the caring role, so being cared for has been difficult at times. Pt stated that today has been a good day, as her therapies have gone well. Pt expressed appreciation for a listening ear to process things with. Chaplain remains available.  This note was prepared by Chaplain Resident, Tacy Learn, MDiv. Chaplain remains available as needed through the on-call pager: [phone].

## 2020-09-16 NOTE — Progress Notes (Signed)
Neurosurgery Service Progress Note  Subjective: No acute events overnight, going stir crazy being stuck in the ICU but otherwise no complaints  Objective: Vitals:   09/16/20 0600 09/16/20 0700 09/16/20 0800 09/16/20 0900  BP: (!) 154/81 140/84 (!) 159/94 (!) 132/92  Pulse:      Resp: (!) 25 16 (!) 27   Temp:   98.9 F (37.2 C)   TempSrc:   Oral   SpO2:      Weight:      Height:        Physical Exam: AOx3, PERRL, EOMI, FS, TM, Strength 5/5 x4, SILTx4, no drift, speech fluent with normal content R groin site c/d/i no obvious hematoma, good distal pulses  Assessment & Plan: 45 y.o. right handed but partially ambidextrous woman w/ aSAH s/p coiling R PComm aneurysm, day #10, some clinical spasm, still pressure-dependent but max'd on one pressor. 3/15 echo WNL  -TCDs tomorrow -I think it's reasonable to try to wean down pressors today and see how she does  Jadene Pierini  09/16/20 9:20 AM

## 2020-09-16 NOTE — Progress Notes (Signed)
Occupational Therapy Treatment Patient Details Name: Lori Pierce MRN: [id number] DOB: February 16, 1976 Today's Date: 09/16/2020    History of present illness Pt is a 45 y.o. F admitted 3/6 with WHOL. 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 Fall on 3/15 with accompanying dysarthria and hypotension. Significant PMH: asthma, allergies.   OT comments  Pt making steady progress towards OT goals this session. Pt expressing feelings of lack of independence and frustration with still being hospitalized ( RN ordered chaplain consult). Pt wanting to be able to tidy up her own room. Assisted pt with IADL task of gathering items around room and rearranging self car items with pt needing min guard for safety with RW, most assist needed when navigating obstacles in tight spaces especially on L side. Pt completed grooming, toileting tasks and LB ADLs with no more than min guard assist. Issued pt paper and pen and encouraged pt to write down tasks completed during the day in order to facilitate feeling of accomplishment. Pt would continue to benefit from skilled occupational therapy while admitted and after d/c to address the below listed limitations in order to improve overall functional mobility and facilitate independence with BADL participation. DC plan remains appropriate, will follow acutely per POC.     Follow Up Recommendations  Outpatient OT;Supervision - Intermittent    Equipment Recommendations  Tub/shower seat    Recommendations for Other Services      Precautions / Restrictions Precautions Precautions: Fall Precaution Comments: L inattention Restrictions Other Position/Activity Restrictions: Systolic range 140 - 160 mmHg ( as of 3/17)       Mobility Bed Mobility Overal bed mobility: Modified Independent Bed Mobility: Supine to Sit;Sit to Supine     Supine to  sit: Modified independent (Device/Increase time) Sit to supine: Modified independent (Device/Increase time)   General bed mobility comments: use of rail and elevated HOB    Transfers Overall transfer level: Needs assistance Equipment used: Rolling walker (2 wheeled) Transfers: Sit to/from Stand Sit to Stand: Min guard         General transfer comment: min gaurd  for rise and steady    Balance Overall balance assessment: Needs assistance Sitting-balance support: Feet supported Sitting balance-Leahy Scale: Good Sitting balance - Comments: sitting EOB   Standing balance support: No upper extremity supported;During functional activity Standing balance-Leahy Scale: Fair Standing balance comment: able to stand at sink to complete ADLs with no LOB, pt also completing IADL task of gathering items around room with pt able to reach out of BOS to reach items with no LOB                           ADL either performed or assessed with clinical judgement   ADL Overall ADL's : Needs assistance/impaired     Grooming: Wash/dry face;Oral care;Standing;Supervision/safety           Upper Body Dressing : Supervision/safety;Set up;Sitting Upper Body Dressing Details (indicate cue type and reason): to don posterior gown Lower Body Dressing: Supervision/safety;Set up Lower Body Dressing Details (indicate cue type and reason): able to don sock from long sitting in bed Toilet Transfer: Min guard;RW;Ambulation;Cueing for safety Toilet Transfer Details (indicate cue type and reason): cues for safety awareness when navigating in tight spaces with RW Toileting- Clothing Manipulation and Hygiene: Supervision/safety;Sit to/from stand       Functional mobility during ADLs:  Min guard;Rolling walker;Cueing for safety General ADL Comments: session focus on IADL tasks related to gather items in room, pt additionally completed ADL tasks such as LB dressing, toileting and UB grooming tasks at  sink     Vision   Additional Comments: L inattention, reports light sensitivity persists but lighting in BR does not bother her   Perception     Praxis      Cognition Arousal/Alertness: Awake/alert Behavior During Therapy: WFL for tasks assessed/performed Overall Cognitive Status: Impaired/Different from baseline Area of Impairment: Safety/judgement;Awareness;Attention                   Current Attention Level: Selective     Safety/Judgement: Decreased awareness of safety;Decreased awareness of deficits Awareness: Emergent   General Comments: pt very overwhelmed this session expressing feelings of loss of control and lack of independence ( chaplain consulted) offered pt suggestion of writing out events from the day so pt can feel like she accomplished tasks during the day. Pt continues to require cues for safety awareness during mobility as pt noted to be unaware of obstacles on L side when ambulating in tight spaces.        Exercises     Shoulder Instructions       General Comments BP from supine 134/118, BP post mobility 141/91    Pertinent Vitals/ Pain       Pain Assessment: No/denies pain  Home Living                                          Prior Functioning/Environment              Frequency  Min 2X/week        Progress Toward Goals  OT Goals(current goals can now be found in the care plan section)  Progress towards OT goals: Progressing toward goals  Acute Rehab OT Goals Patient Stated Goal: Return to home and work OT Goal Formulation: With patient Time For Goal Achievement: 09/21/20 Potential to Achieve Goals: Good  Plan Discharge plan remains appropriate;Frequency remains appropriate    Co-evaluation                 AM-PAC OT "6 Clicks" Daily Activity     Outcome Measure   Help from another person eating meals?: A Little Help from another person taking care of personal grooming?: A Little Help from  another person toileting, which includes using toliet, bedpan, or urinal?: A Little Help from another person bathing (including washing, rinsing, drying)?: A Little Help from another person to put on and taking off regular upper body clothing?: None Help from another person to put on and taking off regular lower body clothing?: None 6 Click Score: 20    End of Session Equipment Utilized During Treatment: Rolling walker  OT Visit Diagnosis: Unsteadiness on feet (R26.81);Other abnormalities of gait and mobility (R26.89);Muscle weakness (generalized) (M62.81);Pain   Activity Tolerance Patient tolerated treatment well   Patient Left in bed;with call bell/phone within reach;with bed alarm set   Nurse Communication Mobility status        Time: 0830-0900 OT Time Calculation (min): 30 min  Charges: OT General Charges $OT Visit: 1 Visit OT Treatments $Self Care/Home Management : 23-37 mins  Lenor Derrick., COTA/L Acute Rehabilitation Services [phone]    Barron Schmid 09/16/2020, 9:16 AM

## 2020-09-16 NOTE — Progress Notes (Signed)
PT Cancellation Note  Patient Details Name: Lori Pierce MRN: [id number] DOB: [birth year]   Cancelled Treatment:    Reason Eval/Treat Not Completed: Fatigue/lethargy limiting ability to participate. PT attempted x 2. Pt just finishing with OT on first attempt, and pt requesting a rest break between session. On second attempt, pt sleeping soundly. Gently attempted rousing pt, but she began waving therapist away and continued sleeping. PT to re-attempt as time allows.   Ilda Foil 09/16/2020, 10:46 AM  Aida Raider, PT  Office # [phone] Pager [phone]

## 2020-09-16 NOTE — Progress Notes (Signed)
[name], MRN:  [id number], DOB:  Feb 02, 1976, LOS: 11 ADMISSION DATE:  09/05/2020, CONSULTATION DATE:  09/09/2020 REFERRING MD:  Dr. 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 On 3/10 She developed neuros changes including LUE weakness, due to concern for vasospasm PCCM was consulted for help with management Past Medical History:  Asthma   Significant Hospital Events:  3/6 Admitted  3/7 coil embolization or right Pcom aneurysm 3/10 CCM consult for suspected vasospasm on TCD 3/11 UDS positive for cocaine (brought by visitor?), PICC line place 3/14 started back on NS, continued on NE and nimotop. 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 Denies headache or back pain.   Objective   Blood pressure (!) 159/94, pulse 77, temperature 98.9 F (37.2 C), temperature source Oral, resp. rate (!) 27, height 5\' 4"  (1.626 m), weight 54.5 kg, last menstrual period 09/03/2020, SpO2 99 %.        Intake/Output Summary (Last 24 hours) at 09/16/2020 0844 Last data filed at 09/16/2020 0800 Gross per 24 hour  Intake 4443.11 ml  Output --  Net 4443.11 ml   Filed Weights   09/06/20 1021  Weight: 54.5 kg    Examination: General: thin woman, in no distress.  HEENT: NCAT anicteric sclera pink mmm Neuro: AAOx4 following commands PERRL symmetrical BUE BLE strength CV:  rrr s1s2 no regm bounding periph pulses  PULM:  CTAb, symmetrical chest expansion, no accessory use  GI: soft round ndnt  Extremities: No acute deformity, no cyanosis or clubbing. 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 Post ictus day 12, post coiling day 10 Remains critically ill due to Cerebral Vasospasm requiring titration of IV fluids and vasopressors for hemodynamic augmentation to prevent delayed ischemic neurological injury.  Cephalgia, improving Low back pain Substance use disorder Possible steroid induced dysphoria  Plan:  - Will quickly taper steroids as may be contributing to emotional lability - Likely at or past peak spasm period. - will try to gradually decrease hemodynamic augmentation such that she might be off pressors as she exits vasospasm window in the next few days.    Daily Goals Checklist  Pain/Anxiety/Delirium protocol (if indicated): oxycodone and Fioricet prn. Lidocaine patch Neuro vitals: every 2 hours AED's: Keppra VAP protocol (if indicated): not intubated  Respiratory support goals: on room air Blood pressure target: titrate NE to SBP 140-160  DVT prophylaxis: ambulating  Nutrition Status: full diet GI prophylaxis: Protonix Fluid status goals: continue IV 0.9%  Urinary catheter: none Central lines: PICC line  Glucose control: euglycemic Mobility/therapy needs: ambulating with PT  Antibiotic de-escalation: none Home medication reconciliation: on hold Daily labs: BMET daily Code Status: full  Family Communication: patient updated.  Disposition: ICU   Goals of Care:  Per primary   Labs   CBC: Recent Labs  Lab 09/09/20 1204  WBC 7.9  HGB 10.4*  HCT 30.6*  MCV 82.3  PLT 571*    Basic Metabolic Panel: Recent Labs  Lab 09/09/20 1204 09/11/20 0500 09/12/20 0937 09/13/20 1325 09/14/20  0544 09/16/20 0632  NA 133* 134* 133* 138 133* 135  K 3.4* 3.2* 3.5 3.3* 4.7 3.6  CL 103 100 103 106 101 105  CO2 24 24 23 23 22 23   GLUCOSE 91 161* 120* 147* 146* 146*  BUN <5* 5* 6 9 9 10   CREATININE 0.67 0.75 0.66 0.60 0.59 0.57  CALCIUM 8.4* 8.4* 8.2* 8.8* 9.7 8.4*  MG 1.6* 2.1  --  1.8 1.9  --    PHOS 3.1  --   --   --   --   --    GFR: Estimated Creatinine Clearance: 78.8 mL/min (by C-G formula based on SCr of 0.57 mg/dL). Recent Labs  Lab 09/09/20 1204  WBC 7.9    Liver Function Tests: Recent Labs  Lab 09/09/20 1204  AST 16  ALT 13  ALKPHOS 29*  BILITOT 0.3  PROT 6.0*  ALBUMIN 3.0*   No results for input(s): LIPASE, AMYLASE in the last 168 hours. No results for input(s): AMMONIA in the last 168 hours.  ABG No results found for: PHART, PCO2ART, PO2ART, HCO3, TCO2, ACIDBASEDEF, O2SAT   Coagulation Profile: No results for input(s): INR, PROTIME in the last 168 hours.  Cardiac Enzymes: No results for input(s): CKTOTAL, CKMB, CKMBINDEX, TROPONINI in the last 168 hours.  HbA1C: Hgb A1c MFr Bld  Date/Time Value Ref Range Status  09/13/2020 01:25 PM 5.4 4.8 - 5.6 % Final    Comment:    (NOTE) Pre diabetes:          5.7%-6.4%  Diabetes:              >6.4%  Glycemic control for   <7.0% adults with diabetes     CBG: Recent Labs  Lab 09/15/20 1538 09/15/20 1950 09/15/20 2321 09/16/20 0327 09/16/20 0744  GLUCAP 169* 138* 134* 130* 154*     Critical care time:    CRITICAL CARE Performed by: 09/18/20   Total critical care time: 40 minutes  Critical care time was exclusive of separately billable procedures and treating other patients.  Critical care was necessary to treat or prevent imminent or life-threatening deterioration.  Critical care was time spent personally by me on the following activities: development of treatment plan with patient and/or surrogate as well as nursing, discussions with consultants, evaluation of patient's response to treatment, examination of patient, obtaining history from patient or surrogate, ordering and performing treatments and interventions, ordering and review of laboratory studies, ordering and review of radiographic studies, pulse oximetry and re-evaluation of patient's condition.  09/18/20, MD  [facility] ICU Physician [facility] [place] [facility]  Pager: [phone] Or Epic Secure Chat After hours: [phone].  09/16/2020, 8:44 AM

## 2020-09-17 ENCOUNTER — Inpatient Hospital Stay (HOSPITAL_COMMUNITY): Payer: No Typology Code available for payment source

## 2020-09-17 DIAGNOSIS — I609 Nontraumatic subarachnoid hemorrhage, unspecified: Secondary | ICD-10-CM | POA: Diagnosis not present

## 2020-09-17 LAB — BASIC METABOLIC PANEL
Anion gap: 7 (ref 5–15)
BUN: 11 mg/dL (ref 6–20)
CO2: 26 mmol/L (ref 22–32)
Calcium: 8.7 mg/dL — ABNORMAL LOW (ref 8.9–10.3)
Chloride: 102 mmol/L (ref 98–111)
Creatinine, Ser: 0.56 mg/dL (ref 0.44–1.00)
GFR, Estimated: >60 mL/min (ref >60)
Glucose, Bld: 116 mg/dL — ABNORMAL HIGH (ref 70–99)
Potassium: 3.6 mmol/L (ref 3.5–5.1)
Sodium: 135 mmol/L (ref 135–145)

## 2020-09-17 LAB — GLUCOSE, CAPILLARY
Glucose-Capillary: 113 mg/dL — ABNORMAL HIGH (ref 70–99)
Glucose-Capillary: 139 mg/dL — ABNORMAL HIGH (ref 70–99)
Glucose-Capillary: 113 mg/dL — ABNORMAL HIGH (ref 70–99)
Glucose-Capillary: 94 mg/dL (ref 70–99)

## 2020-09-17 NOTE — Progress Notes (Signed)
NAMENoella Pierce, MRN:  [id number], DOB:  1975-11-25, LOS: 12 ADMISSION DATE:  09/05/2020, CONSULTATION DATE:  09/09/2020 REFERRING MD:  Dr. 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 On 3/10 She developed neuros changes including LUE weakness, due to concern for vasospasm PCCM was consulted for help with management Past Medical History:  Asthma   Significant Hospital Events:  3/6 Admitted  3/7 coil embolization or right Pcom aneurysm 3/10 CCM consult for suspected vasospasm on TCD 3/11 UDS positive for cocaine (brought by visitor?), PICC line place 3/14 started back on NS, continued on NE and nimotop. 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    Objective   Blood pressure (!) 162/87, pulse 77, temperature 98.3 F (36.8 C), temperature source Oral, resp. rate 19, height 5\' 4"  (1.626 m), weight 54.5 kg, last menstrual period 09/03/2020, SpO2 99 %.        Intake/Output Summary (Last 24 hours) at 09/17/2020 0833 Last data filed at 09/17/2020 0600 Gross per 24 hour  Intake 3545.76 ml  Output --  Net 3545.76 ml   Filed Weights   09/06/20 1021  Weight: 54.5 kg    Examination: General: thin woman, in no distress.  HEENT: NCAT anicteric sclera pink mmm Neuro: AAOx4 following commands PERRL symmetrical BUE BLE strength CV:  rrr s1s2 no regm bounding periph pulses  PULM:  CTAb, symmetrical chest expansion, no accessory use  GI: soft round ndnt  Extremities: No acute deformity, no cyanosis or clubbing. 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 Post ictus day 12, post coiling day 10 Remains critically ill due to Cerebral Vasospasm requiring titration of IV fluids and vasopressors for hemodynamic augmentation to prevent delayed ischemic neurological injury.  Cephalgia, improving Low back pain Substance use disorder Possible steroid induced dysphoria  Plan:  - Continue quick steroid taper.  - Likely at or past peak spasm period. - will try to gradually decrease hemodynamic augmentation again today such that she might be off pressors as she exits vasospasm window in the next few days.  - Continue IV fluids until off pressors.   Daily Goals Checklist  Pain/Anxiety/Delirium protocol (if indicated): oxycodone and Fioricet prn. Lidocaine patch Neuro vitals: every 2 hours AED's: Keppra VAP protocol (if indicated): not intubated  Respiratory support goals: on room air Blood pressure target: titrate NE to SBP 140-160  DVT prophylaxis: ambulating  Nutrition Status: full diet GI prophylaxis: Protonix Fluid status goals: continue IV 0.9%  Urinary catheter: none Central lines: PICC line  Glucose control: euglycemic Mobility/therapy needs: ambulating with PT  Antibiotic de-escalation: none Home medication reconciliation: on hold Daily labs: BMET daily Code Status: full  Family Communication: patient updated.  Disposition: ICU  Goals of Care:  Full code discussed with patient and sister. Patient is expected to make a full recovery.   Labs   CBC: No results for input(s): WBC, NEUTROABS, HGB, HCT, MCV, PLT in the last 168 hours.  Basic Metabolic Panel: Recent Labs  Lab 09/11/20 0500 09/12/20  [id number] 09/13/20 1325 09/14/20 0544 09/16/20 0632 09/17/20 0545  NA 134* 133* 138 133* 135 135  K 3.2* 3.5 3.3* 4.7 3.6 3.6  CL 100 103 106 101 105 102  CO2 24 23 23 22 23 26   GLUCOSE 161* 120* 147* 146* 146* 116*  BUN 5* 6 9 9 10 11   CREATININE 0.75 0.66 0.60 0.59 0.57 0.56   CALCIUM 8.4* 8.2* 8.8* 9.7 8.4* 8.7*  MG 2.1  --  1.8 1.9  --   --    GFR: Estimated Creatinine Clearance: 78.8 mL/min (by C-G formula based on SCr of 0.56 mg/dL). No results for input(s): PROCALCITON, WBC, LATICACIDVEN in the last 168 hours.  Liver Function Tests: No results for input(s): AST, ALT, ALKPHOS, BILITOT, PROT, ALBUMIN in the last 168 hours. No results for input(s): LIPASE, AMYLASE in the last 168 hours. No results for input(s): AMMONIA in the last 168 hours.  ABG No results found for: PHART, PCO2ART, PO2ART, HCO3, TCO2, ACIDBASEDEF, O2SAT   Coagulation Profile: No results for input(s): INR, PROTIME in the last 168 hours.  Cardiac Enzymes: No results for input(s): CKTOTAL, CKMB, CKMBINDEX, TROPONINI in the last 168 hours.  HbA1C: Hgb A1c MFr Bld  Date/Time Value Ref Range Status  09/13/2020 01:25 PM 5.4 4.8 - 5.6 % Final    Comment:    (NOTE) Pre diabetes:          5.7%-6.4%  Diabetes:              >6.4%  Glycemic control for   <7.0% adults with diabetes     CBG: Recent Labs  Lab 09/15/20 2321 09/16/20 0327 09/16/20 0744 09/16/20 2153 09/17/20 0721  GLUCAP 134* 130* 154* 121* 113*     09/18/20, MD [facility] ICU Physician [facility] [place] [facility]  Pager: [phone] Or Epic Secure Chat After hours: [phone].  09/17/2020, 8:33 AM

## 2020-09-17 NOTE — Progress Notes (Addendum)
Neurosurgery Service Progress Note  Subjective: No acute events overnight, emotionally doing better today, no new neurologic issues w/ lowered BP goal yesterday  Objective: Vitals:   09/17/20 0600 09/17/20 0700 09/17/20 0800 09/17/20 0900  BP: 134/74 (!) 162/87 (!) 154/81   Pulse:      Resp: 19 19 (!) 22 (!) 21  Temp:   98.5 F (36.9 C)   TempSrc:   Oral   SpO2:      Weight:      Height:        Physical Exam: AOx3, PERRL, EOMI, FS, TM, Strength 5/5 x4, SILTx4, no drift, speech fluent with normal content R groin site c/d/i no obvious hematoma, good distal pulses  Assessment & Plan: 45 y.o. right handed but partially ambidextrous woman w/ aSAH s/p coiling R PComm aneurysm, day #10, some clinical spasm, still pressure-dependent but max'd on one pressor. 3/15 echo WNL  -f/u TCDs -wean pressors with goal of at least normotension, permissive HTN  Jadene Pierini  09/17/20 9:22 AM

## 2020-09-17 NOTE — Progress Notes (Signed)
Physical Therapy Treatment Patient Details Name: Lori Pierce MRN: [id number] DOB: 17-May-1976 Today's Date: 09/17/2020    History of Present Illness Pt is a 45 y.o. F admitted 3/6 with WHOL. 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 Fall on 3/15 with accompanying dysarthria and hypotension. Significant PMH: asthma, allergies.    PT Comments    The pt was agreeable to PT session with focus on dynamic stability at this time. The pt continues to present with L-sided deficits and inattention as well as globally decreased safety awareness and insight to deficits. The pt was able to maintain static SLS on RLE for 10-15 seconds, but only 2-3 seconds on LLE, and states this is "normal" for her and that she has no concerns. The pt was then able to complete tandem walking, backwards walking, and lateral braiding steps without UE support, but had increased difficulty with balance and coordination of foot placement in all activities. The pt continues to deny challenge, but required minA at times to steady due to LOB. The pt will continue to benefit from skilled PT to progress functional endurance and dynamic stability to facilitate return to independence with mobility.     Follow Up Recommendations  Outpatient PT;Supervision for mobility/OOB     Equipment Recommendations  Rolling walker with 5" wheels    Recommendations for Other Services       Precautions / Restrictions Precautions Precautions: Fall Precaution Comments: L inattention Restrictions Weight Bearing Restrictions: No    Mobility  Bed Mobility Overal bed mobility: Modified Independent             General bed mobility comments: use of rail and elevated HOB    Transfers Overall transfer level: Needs assistance Equipment used: None Transfers: Sit to/from Stand Sit to Stand: Min guard          General transfer comment: min gaurd  for rise and steady  Ambulation/Gait Ambulation/Gait assistance: Min guard;Min assist Gait Distance (Feet): 30 Feet Assistive device: None Gait Pattern/deviations: Step-through pattern;Narrow base of support Gait velocity: decr   General Gait Details: intermittent minA for balance, able to complete tandem walking, backwards walking, and lateral braiding in room without UE support      Modified Rankin (Stroke Patients Only) Modified Rankin (Stroke Patients Only) Pre-Morbid Rankin Score: No symptoms Modified Rankin: Moderately severe disability     Balance Overall balance assessment: Needs assistance Sitting-balance support: Feet supported Sitting balance-Leahy Scale: Good Sitting balance - Comments: sitting EOB   Standing balance support: No upper extremity supported;During functional activity Standing balance-Leahy Scale: Fair Standing balance comment: able to complete dynamic balance challenges with minG-minA to steady                            Cognition Arousal/Alertness: Awake/alert Behavior During Therapy: Flat affect Overall Cognitive Status: Impaired/Different from baseline Area of Impairment: Safety/judgement;Awareness;Attention                   Current Attention Level: Selective   Following Commands: Follows one step commands with increased time Safety/Judgement: Decreased awareness of safety;Decreased awareness of deficits Awareness: Emergent Problem Solving: Slow processing;Difficulty sequencing General Comments: pt able to follow commands, but with decreased insight to safety and deficits, for example, unable to maintain SLS for >2-3 seconds and stating this was "normal" for her because she was "a gymnast in the past"  Exercises      General Comments General comments (skin integrity, edema, etc.): VSS      Pertinent Vitals/Pain Pain Assessment: No/denies pain Pain Intervention(s):  Monitored during session           PT Goals (current goals can now be found in the care plan section) Acute Rehab PT Goals Patient Stated Goal: to shower by herself PT Goal Formulation: With patient Time For Goal Achievement: 09/21/20 Potential to Achieve Goals: Good Progress towards PT goals: Progressing toward goals    Frequency    Min 4X/week      PT Plan Current plan remains appropriate       AM-PAC PT "6 Clicks" Mobility   Outcome Measure  Help needed turning from your back to your side while in a flat bed without using bedrails?: None Help needed moving from lying on your back to sitting on the side of a flat bed without using bedrails?: None Help needed moving to and from a bed to a chair (including a wheelchair)?: A Little Help needed standing up from a chair using your arms (e.g., wheelchair or bedside chair)?: None Help needed to walk in hospital room?: A Little Help needed climbing 3-5 steps with a railing? : A Little 6 Click Score: 21    End of Session Equipment Utilized During Treatment: Gait belt Activity Tolerance: Patient tolerated treatment well Patient left: in chair;with call bell/phone within reach Nurse Communication: Mobility status PT Visit Diagnosis: Unsteadiness on feet (R26.81)     Time: 0998-3382 PT Time Calculation (min) (ACUTE ONLY): 20 min  Charges:  $Gait Training: 8-22 mins                     Rolm Baptise, PT, DPT   Acute Rehabilitation Department Pager #: [phone]   Gaetana Michaelis 09/17/2020, 5:41 PM

## 2020-09-17 NOTE — Progress Notes (Signed)
Transcranial Doppler  Date POD PCO2 HCT BP  MCA ACA PCA OPHT SIPH VERT Basilar  3/8 MR     Right  Left   102  89   -36  -26   -43  31   30  18    68  41   -30  -27   -43      3/9 MR     Right  Left   168  119   -44  -53   49  42   23  18   48  52   -23  -32   -40      3/11 RH     Right  Left   124  96   22  48   36  53   30  28   35  31   39  *         3/14 MR      Right  Left   119  144   -28  *   47  51   25  27   53  97   -32  -31   -41      3/16 GC      Right  Left   137  139   -32  -36   64  39   30  20   30  25    -35  -66   -52      3/18 MR     Right  Left   164  149   -30  -59   52  22   30  31    86  57   *  -34   -67           Right  Left                                        MCA = Middle Cerebral Artery      OPHT = Opthalmic Artery     BASILAR = Basilar Artery   ACA = Anterior Cerebral Artery     SIPH = Carotid Siphon PCA = Posterior Cerebral Artery   VERT = Verterbral Artery                   Normal MCA = 62+\-12 ACA = 50+\-12 PCA = 42+\-23  * - Unable to insonate 09/17/2020 12:52 PM

## 2020-09-17 NOTE — Progress Notes (Addendum)
  Speech Language Pathology Treatment: Cognitive-Linquistic  Patient Details Name: Lori Pierce MRN: [id number] DOB: October 01, 1975 Today's Date: 09/17/2020 Time: 1202-1228 SLP Time Calculation (min) (ACUTE ONLY): 26 min  Assessment / Plan / Recommendation Clinical Impression  Mild improvements are evident in the areas of problem solving, awareness and reasoning. She was able to recall and explain issue yesterday with her BP and parts of treatment plan for discharge from ICU with mild clarification needed. She listened to voicemail recordings and answered questions, made inferences with 75% accuracy. Pt sequenced written steps re: scenarios (refilling prescriptions). She reported she is calling when she needs assist. Will continue intervention.    HPI HPI: Lori Pierce is a 45 y.o. female with history of asthma and allergies who presented to the ED with worst headache of life. 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 Underwent successful coil embolization of RCA aneurysm 3/7.      SLP Plan  Continue with current plan of care       Recommendations                   Oral Care Recommendations: Oral care BID Follow up Recommendations: Outpatient SLP SLP Visit Diagnosis: Cognitive communication deficit (S49.675) Plan: Continue with current plan of care                       Royce Macadamia 09/17/2020, 3:33 PM   Breck Coons Lonell Face.Ed Nurse, children's [phone] Office [phone]

## 2020-09-18 ENCOUNTER — Inpatient Hospital Stay (HOSPITAL_COMMUNITY): Payer: No Typology Code available for payment source

## 2020-09-18 LAB — GLUCOSE, CAPILLARY
Glucose-Capillary: 76 mg/dL (ref 70–99)
Glucose-Capillary: 122 mg/dL — ABNORMAL HIGH (ref 70–99)
Glucose-Capillary: 194 mg/dL — ABNORMAL HIGH (ref 70–99)
Glucose-Capillary: 137 mg/dL — ABNORMAL HIGH (ref 70–99)

## 2020-09-18 LAB — BASIC METABOLIC PANEL WITH GFR
Anion gap: 7 (ref 5–15)
BUN: 14 mg/dL (ref 6–20)
CO2: 25 mmol/L (ref 22–32)
Calcium: 8.8 mg/dL — ABNORMAL LOW (ref 8.9–10.3)
Chloride: 103 mmol/L (ref 98–111)
Creatinine, Ser: 0.58 mg/dL (ref 0.44–1.00)
GFR, Estimated: >60 mL/min (ref >60)
Glucose, Bld: 88 mg/dL (ref 70–99)
Potassium: 3.6 mmol/L (ref 3.5–5.1)
Sodium: 135 mmol/L (ref 135–145)

## 2020-09-18 MED ORDER — MORPHINE SULFATE (PF) 2 MG/ML IV SOLN
INTRAVENOUS | Status: AC
  Filled 2020-09-18: qty 1

## 2020-09-18 MED ORDER — SODIUM CHLORIDE 0.9 % IV BOLUS
500.0000 mL | Freq: Once | INTRAVENOUS | Status: AC
  Administered 2020-09-18: 500 mL via INTRAVENOUS

## 2020-09-18 MED ORDER — MORPHINE SULFATE (PF) 2 MG/ML IV SOLN
2.0000 mg | Freq: Once | INTRAVENOUS | Status: AC
  Administered 2020-09-18: 2 mg via INTRAVENOUS

## 2020-09-18 MED ORDER — DEXAMETHASONE SODIUM PHOSPHATE 4 MG/ML IJ SOLN
4.0000 mg | Freq: Two times a day (BID) | INTRAMUSCULAR | Status: DC
  Administered 2020-09-18 – 2020-09-20 (×4): 4 mg via INTRAVENOUS
  Filled 2020-09-18 (×4): qty 1

## 2020-09-18 MED ORDER — DEXAMETHASONE SODIUM PHOSPHATE 10 MG/ML IJ SOLN
10.0000 mg | Freq: Once | INTRAMUSCULAR | Status: AC
  Administered 2020-09-18: 10 mg via INTRAVENOUS
  Filled 2020-09-18: qty 1

## 2020-09-18 NOTE — Progress Notes (Signed)
0730: Upon initial assessment, pt was found with LUE weakness, facial drooping and slurred speech. MD was notified and levo was restarted. Pt then began c/o pain. Fioricet was given and pt stated pain was much better.  1400: Pt began c/o once more of severe head pain. Fioricet was given and MD was notified. Verbal order for 2mg  morphine and stat head CT was done. After returning to room, pt stated she felt much better and her pain was gone.

## 2020-09-18 NOTE — Progress Notes (Signed)
NAMEKizzy Olafson, MRN:  [id number], DOB:  10-08-1975, LOS: 13 ADMISSION DATE:  09/05/2020, CONSULTATION DATE:  09/09/2020 REFERRING MD:  Dr. 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 On 3/10 She developed neuros changes including LUE weakness, due to concern for vasospasm PCCM was consulted for help with management Past Medical History:  Asthma   Significant Hospital Events:  3/6 Admitted  3/7 coil embolization or right Pcom aneurysm 3/10 CCM consult for suspected vasospasm on TCD 3/11 UDS positive for cocaine (brought by visitor?), PICC line place 3/14 started back on NS, continued on NE and nimotop. Steroids started for possible chemical meningitis  3/15-16 continued hemodynamic augmentation for R MCA vasospasm on TCD manifested by odd behavior and left facial weakness when BP<140   Interim History / Subjective:   Recurrent altered mental status and left facial droop when blood pressure goal decrease to 120-140.  Complaining of worsening headache today.  Objective   Blood pressure (!) 184/98, pulse 77, temperature 98.3 F (36.8 C), temperature source Oral, resp. rate 18, height 5\' 4"  (1.626 m), weight 54.5 kg, last menstrual period 09/03/2020, SpO2 99 %.        Intake/Output Summary (Last 24 hours) at 09/18/2020 0951 Last data filed at 09/18/2020 0900 Gross per 24 hour  Intake 3135.63 ml  Output --  Net 3135.63 ml   Filed Weights   09/06/20 1021  Weight: 54.5 kg    Examination: General: thin woman, in no distress.  HEENT: NCAT anicteric sclera pink mmm Neuro: AAOx4 following commands PERRL, left facial droop.  Mild left pronator drift.  Behavior somewhat bizarre lying in bed with food stuck to her face peer CV:  rrr s1s2 no regm bounding periph pulses  PULM:  CTAb, symmetrical chest expansion, no accessory use  GI: soft round  ndnt  Extremities: No acute deformity, no cyanosis or clubbing. 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 Post ictus day 12, post coiling day 10 Remains critically ill due to Cerebral Vasospasm requiring titration of IV fluids and vasopressors for hemodynamic augmentation to prevent delayed ischemic neurological injury.  Cephalgia, improving Low back pain Substance use disorder Possible steroid induced dysphoria  Plan:  -Steroid stopped today. -Repeat CT if cephalgia does not improve with usual therapy. -Ongoing vasospasm as evidenced by worsening examination.  We will bolus fluids and increase vasopressors back to target systolic blood pressure 140-160.  Daily Goals Checklist  Pain/Anxiety/Delirium protocol (if indicated): oxycodone and Fioricet prn. Lidocaine patch Neuro vitals: every 2 hours AED's: Keppra VAP protocol (if indicated): not intubated  Respiratory support goals: on room air Blood pressure target: titrate NE to SBP 140-160  DVT prophylaxis: ambulating  Nutrition Status: full diet GI prophylaxis: Protonix Fluid status goals: continue IV 0.9%  Urinary catheter: none Central lines: PICC line  Glucose control: euglycemic Mobility/therapy needs: ambulating with PT  Antibiotic de-escalation: none Home medication reconciliation: on hold Daily labs: BMET daily Code Status: full  Family Communication: patient updated.  Disposition: ICU  Goals of Care:  Full code discussed with patient and sister. Patient is expected to make a full recovery.   Labs   CBC: No results for input(s): WBC, NEUTROABS, HGB, HCT, MCV, PLT in the last 168  hours.  Basic Metabolic Panel: Recent Labs  Lab 09/13/20 1325 09/14/20 0544 09/16/20 0632 09/17/20 0545 09/18/20 0230  NA 138 133* 135 135 135  K 3.3* 4.7 3.6 3.6 3.6  CL 106 101 105 102 103  CO2 23 22 23 26 25   GLUCOSE 147* 146*  146* 116* 88  BUN 9 9 10 11 14   CREATININE 0.60 0.59 0.57 0.56 0.58  CALCIUM 8.8* 9.7 8.4* 8.7* 8.8*  MG 1.8 1.9  --   --   --    GFR: Estimated Creatinine Clearance: 78.8 mL/min (by C-G formula based on SCr of 0.58 mg/dL). No results for input(s): PROCALCITON, WBC, LATICACIDVEN in the last 168 hours.  Liver Function Tests: No results for input(s): AST, ALT, ALKPHOS, BILITOT, PROT, ALBUMIN in the last 168 hours. No results for input(s): LIPASE, AMYLASE in the last 168 hours. No results for input(s): AMMONIA in the last 168 hours.  ABG No results found for: PHART, PCO2ART, PO2ART, HCO3, TCO2, ACIDBASEDEF, O2SAT   Coagulation Profile: No results for input(s): INR, PROTIME in the last 168 hours.  Cardiac Enzymes: No results for input(s): CKTOTAL, CKMB, CKMBINDEX, TROPONINI in the last 168 hours.  HbA1C: Hgb A1c MFr Bld  Date/Time Value Ref Range Status  09/13/2020 01:25 PM 5.4 4.8 - 5.6 % Final    Comment:    (NOTE) Pre diabetes:          5.7%-6.4%  Diabetes:              >6.4%  Glycemic control for   <7.0% adults with diabetes     CBG: Recent Labs  Lab 09/17/20 0721 09/17/20 1115 09/17/20 1621 09/17/20 2204 09/18/20 0720  GLUCAP 113* 139* 113* 94 76    CRITICAL CARE Performed by: [id number]   Total critical care time: 40 minutes  Critical care time was exclusive of separately billable procedures and treating other patients.  Critical care was necessary to treat or prevent imminent or life-threatening deterioration.  Critical care was time spent personally by me on the following activities: development of treatment plan with patient and/or surrogate as well as nursing, discussions with consultants, evaluation of patient's response to treatment, examination of patient, obtaining history from patient or surrogate, ordering and performing treatments and interventions, ordering and review of laboratory studies, ordering and review of radiographic studies, pulse  oximetry, re-evaluation of patient's condition and participation in multidisciplinary rounds.  09/20/20, MD [facility] ICU Physician [facility] [place] Critical Care  Pager: [phone] Mobile: [phone] After hours: [phone].    09/18/2020, 9:51 AM

## 2020-09-18 NOTE — Progress Notes (Signed)
Patient ID: Lori Pierce, female   DOB: 1975/07/24, 45 y.o.   MRN: [id number] BP 130/68   Pulse 77   Temp 98.8 F (37.1 C) (Oral)   Resp (!) 27   Ht 5\' 4"  (1.626 m)   Wt 54.5 kg   LMP 09/03/2020   SpO2 99%   BMI 20.62 kg/m  Alert and oriented x 4, speech is clear and fluent Moving all extremities well No drift Symmetric facies Tongue and uvula midline Doing well

## 2020-09-19 LAB — GLUCOSE, CAPILLARY
Glucose-Capillary: 198 mg/dL — ABNORMAL HIGH (ref 70–99)
Glucose-Capillary: 122 mg/dL — ABNORMAL HIGH (ref 70–99)
Glucose-Capillary: 113 mg/dL — ABNORMAL HIGH (ref 70–99)
Glucose-Capillary: 101 mg/dL — ABNORMAL HIGH (ref 70–99)

## 2020-09-19 NOTE — Progress Notes (Signed)
BP lower than listed goal. MD ordered to decreased Levo by 25% Q6 hours and determine if any neuro change. Per MD following neuro status and not necessarily BP currently.   Patient BP lower and neuro status unchanged

## 2020-09-19 NOTE — Progress Notes (Signed)
Patient ID: Lori Pierce, female   DOB: [birth year], 45 y.o.   MRN: [id number] BP (!) 154/95 (BP Location: Left Arm)   Pulse 77   Temp 99 F (37.2 C) (Oral)   Resp (!) 22   Ht 5\' 4"  (1.626 m)   Wt 54.5 kg   LMP 09/03/2020   SpO2 100%   BMI 20.62 kg/m  Alert and oriented x 4, speech is clear and fluent Moving all extremities well Needs the pressors Agitated at this time, quite tired of being in the hospital

## 2020-09-19 NOTE — Progress Notes (Signed)
NAMEAshlyne Pierce, MRN:  [id number], DOB:  1975/12/22, LOS: 14 ADMISSION DATE:  09/05/2020, CONSULTATION DATE:  09/09/2020 REFERRING MD:  Dr. 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 On 3/10 She developed neuros changes including LUE weakness, due to concern for vasospasm PCCM was consulted for help with management Past Medical History:  Asthma   Significant Hospital Events:  3/6 Admitted  3/7 coil embolization or right Pcom aneurysm 3/10 CCM consult for suspected vasospasm on TCD 3/11 UDS positive for cocaine (brought by visitor?), PICC line place 3/14 started back on NS, continued on NE and nimotop. Steroids started for possible chemical meningitis  3/15-16 continued hemodynamic augmentation for R MCA vasospasm on TCD manifested by odd behavior and left facial weakness when BP<140 3/19 - Left facial droop again when BP lower to <140   Interim History / Subjective:   No focal deficits again today. No headache. Able to ambulate without assistance. Severe headache yesterday but CT negative.   Objective   Blood pressure (!) 149/86, pulse 77, temperature 98.5 F (36.9 C), temperature source Oral, resp. rate 20, height 5\' 4"  (1.626 m), weight 54.5 kg, last menstrual period 09/03/2020, SpO2 99 %.        Intake/Output Summary (Last 24 hours) at 09/19/2020 1155 Last data filed at 09/19/2020 1000 Gross per 24 hour  Intake 2795.28 ml  Output --  Net 2795.28 ml   Filed Weights   09/06/20 1021  Weight: 54.5 kg    Examination: General: thin woman, in no distress.  HEENT: NCAT anicteric sclera pink mmm Neuro: AAOx4 following commands PERRL, left facial droop.  No focal deficits normal affect today.  CV:  rrr s1s2 no regm bounding periph pulses  PULM:  CTAb, symmetrical chest expansion, no accessory use  GI: soft round ndnt  Extremities: No  acute deformity, no cyanosis or clubbing. 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 Post ictus day 14 post coiling day 13 Remains critically ill due to Cerebral Vasospasm requiring titration of IV fluids and vasopressors for hemodynamic augmentation to prevent delayed ischemic neurological injury.  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  Responsive to steroids. Low back pain-chronic Substance use disorder Possible steroid induced dysphoria  Plan:  -Resumed dexamethasone low dose for headache -Ongoing vasospasm as evidenced by worsening examination yesterday.  Continue hemodynamic augmentation at current level for today and try decreasing blood pressure goals tomorrow. -Patient is anxious to go home as soon as possible.  Have reemphasized the importance of staying in hospital to avoid risk of permanent deficits from stroke.  Daily Goals Checklist  Pain/Anxiety/Delirium protocol (if indicated): oxycodone and Fioricet prn. Lidocaine patch Neuro vitals: every 2 hours AED's: Keppra VAP protocol (if indicated): not intubated  Respiratory support goals: on room air Blood pressure target: titrate NE to SBP 140-160  DVT prophylaxis: ambulating  Nutrition Status: full diet GI prophylaxis: Protonix Fluid status goals: continue IV 0.9%  Urinary catheter: none Central lines: PICC line  Glucose control: euglycemic Mobility/therapy needs: ambulating with PT  Antibiotic de-escalation: none Home medication reconciliation: on hold Daily labs: BMET daily Code Status: full  Family Communication: patient updated.  Disposition: ICU  Goals of Care:  Full code discussed with patient  and sister. Patient is expected to make a full recovery.   Labs   CBC: No results for input(s): WBC, NEUTROABS, HGB, HCT, MCV, PLT in the last 168 hours.  Basic Metabolic Panel: Recent Labs   Lab 09/13/20 1325 09/14/20 0544 09/16/20 0632 09/17/20 0545 09/18/20 0230  NA 138 133* 135 135 135  K 3.3* 4.7 3.6 3.6 3.6  CL 106 101 105 102 103  CO2 23 22 23 26 25   GLUCOSE 147* 146* 146* 116* 88  BUN 9 9 10 11 14   CREATININE 0.60 0.59 0.57 0.56 0.58  CALCIUM 8.8* 9.7 8.4* 8.7* 8.8*  MG 1.8 1.9  --   --   --    GFR: Estimated Creatinine Clearance: 78.8 mL/min (by C-G formula based on SCr of 0.58 mg/dL). No results for input(s): PROCALCITON, WBC, LATICACIDVEN in the last 168 hours.  Liver Function Tests: No results for input(s): AST, ALT, ALKPHOS, BILITOT, PROT, ALBUMIN in the last 168 hours. No results for input(s): LIPASE, AMYLASE in the last 168 hours. No results for input(s): AMMONIA in the last 168 hours.  ABG No results found for: PHART, PCO2ART, PO2ART, HCO3, TCO2, ACIDBASEDEF, O2SAT   Coagulation Profile: No results for input(s): INR, PROTIME in the last 168 hours.  Cardiac Enzymes: No results for input(s): CKTOTAL, CKMB, CKMBINDEX, TROPONINI in the last 168 hours.  HbA1C: Hgb A1c MFr Bld  Date/Time Value Ref Range Status  09/13/2020 01:25 PM 5.4 4.8 - 5.6 % Final    Comment:    (NOTE) Pre diabetes:          5.7%-6.4%  Diabetes:              >6.4%  Glycemic control for   <7.0% adults with diabetes     CBG: Recent Labs  Lab 09/18/20 1122 09/18/20 1631 09/18/20 2141 09/19/20 0811 09/19/20 1151  GLUCAP 122* 194* 137* 198* 122*    CRITICAL CARE Performed by: 09/21/20   Total critical care time: 35 minutes  Critical care time was exclusive of separately billable procedures and treating other patients.  Critical care was necessary to treat or prevent imminent or life-threatening deterioration.  Critical care was time spent personally by me on the following activities: development of treatment plan with patient and/or surrogate as well as nursing, discussions with consultants, evaluation of patient's response to treatment, examination of  patient, obtaining history from patient or surrogate, ordering and performing treatments and interventions, ordering and review of laboratory studies, ordering and review of radiographic studies, pulse oximetry, re-evaluation of patient's condition and participation in multidisciplinary rounds.  09/21/20, MD [facility] ICU Physician [facility] [place] Critical Care  Pager: [phone] Mobile: [phone] After hours: [phone].    09/19/2020, 11:55 AM

## 2020-09-20 ENCOUNTER — Other Ambulatory Visit: Payer: Self-pay | Admitting: Physician Assistant

## 2020-09-20 ENCOUNTER — Inpatient Hospital Stay (HOSPITAL_COMMUNITY): Payer: No Typology Code available for payment source

## 2020-09-20 LAB — GLUCOSE, CAPILLARY
Glucose-Capillary: 140 mg/dL — ABNORMAL HIGH (ref 70–99)
Glucose-Capillary: 95 mg/dL (ref 70–99)

## 2020-09-20 MED ORDER — NIMODIPINE 30 MG PO CAPS
60.0000 mg | ORAL_CAPSULE | ORAL | 0 refills | Status: AC
Start: 2020-09-20 — End: 2020-09-26

## 2020-09-20 MED FILL — niMODipine 30 MG CAPS: 30 | 6 days supply | Qty: 72 | Fill #0

## 2020-09-20 NOTE — TOC Transition Note (Signed)
Transition of Care [facility]) - CM/SW Discharge Note   Patient Details  Name: Lori Pierce MRN: [id number] Date of Birth: [birth year]  Transition of Care [facility]) CM/SW Contact:  Glennon Mac, RN Phone Number: 09/20/2020, 3:34 PM   Clinical Narrative:  Pt is a 45 y.o. F admitted 3/6 with WHOL. 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 PTA, pt independent and living at home.  She plans to dc home with her sister in [place].  Pt's PCP is Dr. Courtney Paris at [facility]; video visit arranged for patient on 09/23/20 at 8am.  Will fax clinical information to PCP at [phone].  Unable to get Rx filled at [facility], as all meds have to go through PCP, per [facility].  Pt's PCP is off today.  Will provide medication assistance through [facility] program; approval by Ruben Im in [facility] pharmacy.  Pt appreciative of all help given.         Final next level of care: OP Rehab Barriers to Discharge: Barriers Resolved   Patient Goals and CMS Choice Patient states their goals for this hospitalization and ongoing recovery are:: to go home                             Discharge Plan and Services   Discharge Planning Services: CM Consult                                 Social Determinants of Health (SDOH) Interventions     Readmission Risk Interventions Readmission Risk Prevention Plan 09/20/2020  Post Dischage Appt Complete  Medication Screening Complete  Transportation Screening Complete  Some recent data might be hidden   Quintella Baton, RN, BSN  Trauma/Neuro ICU Case Manager [phone]

## 2020-09-20 NOTE — Progress Notes (Addendum)
Lori Pierce, MRN:  [id number], DOB:  Dec 01, 1975, LOS: 15 ADMISSION DATE:  09/05/2020, CONSULTATION DATE:  09/09/2020 REFERRING MD:  Dr. 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 On 3/10 She developed neuros changes including LUE weakness, due to concern for vasospasm PCCM was consulted for help with management Past Medical History:  Asthma   Significant Hospital Events:  3/6 Admitted  3/7 coil embolization or right Pcom aneurysm 3/10 CCM consult for suspected vasospasm on TCD 3/11 UDS positive for cocaine (brought by visitor?), PICC line place 3/14 started back on NS, continued on NE and nimotop. Steroids started for possible chemical meningitis  3/15-16 continued hemodynamic augmentation for R MCA vasospasm on TCD manifested by odd behavior and left facial weakness when BP<140 3/19 - Left facial droop again when BP lower to <140 3/21 - off pressors as of  Interim History / Subjective:   No acute events overnight. Off levophed this morning with BP dipping into 130s with no recurrence of symptoms.   Objective   Blood pressure (!) 136/91, pulse 77, temperature 98.7 F (37.1 C), temperature source Oral, resp. rate 18, height 5\' 4"  (1.626 m), weight 54.5 kg, last menstrual period 09/03/2020, SpO2 100 %.        Intake/Output Summary (Last 24 hours) at 09/20/2020 0858 Last data filed at 09/20/2020 0700 Gross per 24 hour  Intake 2810.83 ml  Output --  Net 2810.83 ml   Filed Weights   09/06/20 1021  Weight: 54.5 kg    Examination: General: Thin adult female in NAD HEENT: [place]/AT, PERRL, no JVD Neuro: Alert, oriented, non-focal CV:  RRR, no MRG PULM: Clear no distress GI: Soft, non-tender, non-distended Extremities: No acute deformity, no cyanosis or clubbing. 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  - TCD's pending today - She is very anxious to go home and is hopeful she can do today. Will discuss with neurosurgery the best case scenario for her discharge.   Substance abuse: UDS on presentation positive for cocaine and barbituates - Substance abuse counseling.   Asthma: no documents home treatments.  - outpatient follow up.   Daily Goals Checklist  Pain/Anxiety/Delirium protocol (if indicated): oxycodone and Fioricet prn. Lidocaine patch Neuro vitals: every 2 hours AED's: Keppra Respiratory support goals: on room air Blood pressure target: titrate NE to SBP 140-160  DVT prophylaxis: ambulating  Nutrition Status: full diet GI prophylaxis: Protonix Central lines: PICC line - remove Mobility/therapy needs: ambulating with PT  Code Status: full  Family Communication: patient updated Disposition: ICU  Goals of Care:  Full  Labs   CBC: No results for input(s): WBC, NEUTROABS, HGB, HCT, MCV, PLT in the last 168 hours.  Basic Metabolic Panel: Recent Labs  Lab 09/13/20 1325 09/14/20 0544 09/16/20 0632 09/17/20 0545 09/18/20 0230  NA 138 133* 135 135 135  K 3.3* 4.7 3.6 3.6 3.6  CL 106 101 105 102 103  CO2 23 22 23 26 25   GLUCOSE 147* 146* 146* 116* 88  BUN 9 9 10 11 14   CREATININE 0.60 0.59  0.57 0.56 0.58  CALCIUM 8.8* 9.7 8.4* 8.7* 8.8*  MG 1.8 1.9  --   --   --    GFR: Estimated Creatinine Clearance: 78.8 mL/min (by C-G formula based on SCr of 0.58 mg/dL). No results for input(s): PROCALCITON, WBC, LATICACIDVEN in the last 168 hours.  Liver Function Tests: No results for input(s): AST, ALT, ALKPHOS, BILITOT, PROT, ALBUMIN in the last 168 hours. No results for input(s): LIPASE, AMYLASE in the last 168  hours. No results for input(s): AMMONIA in the last 168 hours.  ABG No results found for: PHART, PCO2ART, PO2ART, HCO3, TCO2, ACIDBASEDEF, O2SAT   Coagulation Profile: No results for input(s): INR, PROTIME in the last 168 hours.  Cardiac Enzymes: No results for input(s): CKTOTAL, CKMB, CKMBINDEX, TROPONINI in the last 168 hours.  HbA1C: Hgb A1c MFr Bld  Date/Time Value Ref Range Status  09/13/2020 01:25 PM 5.4 4.8 - 5.6 % Final    Comment:    (NOTE) Pre diabetes:          5.7%-6.4%  Diabetes:              >6.4%  Glycemic control for   <7.0% adults with diabetes     CBG: Recent Labs  Lab 09/19/20 0811 09/19/20 1151 09/19/20 1748 09/19/20 2208 09/20/20 0743  GLUCAP 198* 122* 113* 101* 140*    Critical care time: na   Joneen Roach, AGACNP-BC [facility]  See Amion for personal pager PCCM on call pager [phone] until 7pm. Please call Elink 7p-7a. [phone]  09/20/2020 9:48 AM

## 2020-09-20 NOTE — Progress Notes (Signed)
Pt left AMA at this time.  IV removed and verbal instructions given to pt.  Pt very pleasant despite her wanting to leave AMA.  Pt to f/u with primary MD on Thursday 3/24.

## 2020-09-20 NOTE — Progress Notes (Signed)
  NEUROSURGERY PROGRESS NOTE   No issues overnight. Working on weaning off pressors.  Patient feels well. No concerns. No episodes of LUE N/T or slurred speech with decreased BP  EXAM:  BP (!) 136/91   Pulse 77   Temp 98.7 F (37.1 C) (Oral)   Resp 18   Ht 5\' 4"  (1.626 m)   Wt 54.5 kg   LMP 09/03/2020   SpO2 100%   BMI 20.62 kg/m   Awake, alert, oriented  Speech fluent, appropriate  CN grossly intact  MAEW, nonfocal No drift  IMPRESSION/PLAN 45 y.o. female SAH d#13 s/p coiling R PComm aneurysm. Clinical spasm that responded well to [facility]. Tolerating weaning without recurrence in sx. - continue to wean pressors. F/U TCDs today - continue nimotop, keppra

## 2020-09-20 NOTE — Progress Notes (Signed)
Physical Therapy Treatment Patient Details Name: Lori Pierce MRN: [id number] DOB: 30-Nov-1975 Today's Date: 09/20/2020    History of Present Illness Pt is a 45 y.o. F admitted 3/6 with WHOL. 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 Fall on 3/15 with accompanying dysarthria and hypotension. Significant PMH: asthma, allergies.    PT Comments    The pt continues to make good progress with mobility, dynamic stability, and independence with transfers. She was able to complete ~250 ft hallway ambulation without use of AD or UE support while also completing challenges such as tandem walking, backwards walking, lateral stepping/brainding patterns, changes in speed, and dual task with UE coordination tasks. The pt was challenged by sequencing all new patterns, slowed gait with all dual tasks, and had difficulty with coordination and consistent placement of LLE with higher level stepping challenges. The pt was much more stable with general gait at this time, but demos deficits with higher level challenge and will continue to benefit from skilled PT acutely and following d/c to maximize safety with mobility, coordination, and strength in LLE to further reduce risk of falls.     Follow Up Recommendations  Outpatient PT;Supervision for mobility/OOB     Equipment Recommendations  Rolling walker with 5" wheels    Recommendations for Other Services       Precautions / Restrictions Precautions Precautions: Fall Precaution Comments: L inattention Restrictions Weight Bearing Restrictions: No    Mobility  Bed Mobility Overal bed mobility: Modified Independent Bed Mobility: Supine to Sit;Sit to Supine     Supine to sit: Modified independent (Device/Increase time)          Transfers Overall transfer level: Needs assistance Equipment used: None Transfers: Sit to/from  Stand Sit to Stand: Supervision         General transfer comment: supervision for safety, no physical assist needed  Ambulation/Gait Ambulation/Gait assistance: Min guard Gait Distance (Feet): 250 Feet Assistive device: None Gait Pattern/deviations: Step-through pattern;Narrow base of support Gait velocity: 0.8 m/s Gait velocity interpretation: 1.31 - 2.62 ft/sec, indicative of limited community ambulator General Gait Details: no assist given today, pt with inconsistent step length and width but no LOB. challenged by addition of UE coordionation tasks while walking, slowed pace with increased sway and difficulty with fine motor task.    Modified Rankin (Stroke Patients Only) Modified Rankin (Stroke Patients Only) Pre-Morbid Rankin Score: No symptoms Modified Rankin: Moderately severe disability     Balance Overall balance assessment: Needs assistance Sitting-balance support: Feet supported Sitting balance-Leahy Scale: Good Sitting balance - Comments: able to reach down to don/tie shoes from sitting on EOB without issue   Standing balance support: No upper extremity supported;During functional activity Standing balance-Leahy Scale: Fair Standing balance comment: able to complete dynamic balance challenges with minG-minA to steady             High level balance activites: Side stepping;Braiding;Backward walking;Direction changes;Turns;Sudden stops High Level Balance Comments: Pt able to maintain balance with lateral stepping, braiding, and different stepping patterns, tandem walking, backwards walking, changes in speed, and quick turns. The pt was challenged by learning all new patterns, and struggles with coordinating placement of LLE with side stepping and tandem walking            Cognition Arousal/Alertness: Awake/alert Behavior During Therapy: WFL for tasks assessed/performed Overall Cognitive Status: Impaired/Different from baseline Area of Impairment:  Awareness;Safety/judgement;[place] Recovery Scale  Current Attention Level: Selective     Safety/Judgement: Decreased awareness of deficits;Decreased awareness of safety Awareness: Intellectual Problem Solving: Slow processing;Difficulty sequencing;Requires verbal cues General Comments: Pt able to follow all commands without issue, significant difficulty identifying safety issues even when happening, requiring cues to initiate all adjustments to movements. No awareness of lines/leads         General Comments General comments (skin integrity, edema, etc.): HR to 130s with gait. Pt with significant difficulty with tying shoes, left with gown to practice ties and snapping at end of session.       Pertinent Vitals/Pain Pain Assessment: No/denies pain Pain Intervention(s): Monitored during session           PT Goals (current goals can now be found in the care plan section) Acute Rehab PT Goals Patient Stated Goal: to shower by herself PT Goal Formulation: With patient Time For Goal Achievement: 09/21/20 Potential to Achieve Goals: Good Progress towards PT goals: Progressing toward goals    Frequency    Min 4X/week      PT Plan Current plan remains appropriate       AM-PAC PT "6 Clicks" Mobility   Outcome Measure  Help needed turning from your back to your side while in a flat bed without using bedrails?: None Help needed moving from lying on your back to sitting on the side of a flat bed without using bedrails?: None Help needed moving to and from a bed to a chair (including a wheelchair)?: A Little Help needed standing up from a chair using your arms (e.g., wheelchair or bedside chair)?: None Help needed to walk in hospital room?: A Little Help needed climbing 3-5 steps with a railing? : A Little 6 Click Score: 21    End of Session Equipment Utilized During Treatment: Gait belt Activity Tolerance: Patient tolerated treatment well Patient left:  in bed;with call bell/phone within reach;with bed alarm set Nurse Communication: Mobility status PT Visit Diagnosis: Unsteadiness on feet (R26.81)     Time: 6754-4920 PT Time Calculation (min) (ACUTE ONLY): 32 min  Charges:  $Gait Training: 8-22 mins $Neuromuscular Re-education: 8-22 mins                     Rolm Baptise, PT, DPT   Acute Rehabilitation Department Pager #: [phone]   Gaetana Michaelis 09/20/2020, 9:08 AM

## 2020-09-20 NOTE — Progress Notes (Signed)
Pt leaving against medical advice.  Nimotop prescription written for patient and given to patient.  Pt fully aware of risks of leaving AMA and that insurance will not pay.  She states she is going to be staying with her sister and she will look for symptoms of vasospasm and will know when to call 911.  Pt has a ride coming at 6pm.  She has her clothing on; IV pending removal.

## 2020-09-20 NOTE — Progress Notes (Signed)
  Speech Language Pathology Treatment: Cognitive-Linquistic  Patient Details Name: Audreena Sachdeva MRN: [id number] DOB: 10/19/1975 Today's Date: 09/20/2020 Time: 5672-0919 SLP Time Calculation (min) (ACUTE ONLY): 21 min  Assessment / Plan / Recommendation Clinical Impression  Pt continues to demonstrate improvements, has met her goals and is discharging this afternoon with her friend. She completed an activity targeting executive functions primarily planning and deductive reasoning. She achieved 90% accuracy and was able to express appropriate reasoning for her choice. 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 Outpatient ST for evaluation and possible treat.    HPI HPI: Carling Blunt Dulak is a 45 y.o. female with history of asthma and allergies who presented to the ED with worst headache of life. 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 Underwent successful coil embolization of RCA aneurysm 3/7.      SLP Plan  Other (Comment) (discharging this afternoon;)       Recommendations                   Oral Care Recommendations: Oral care BID Follow up Recommendations: Outpatient SLP SLP Visit Diagnosis: Cognitive communication deficit (C02.217) Plan: Other (Comment) (discharging this afternoon;)             Houston Siren 09/20/2020, 4:00 PM  Orbie Pyo Colvin Caroli.Ed Risk analyst [phone] Office [phone]

## 2020-09-20 NOTE — Progress Notes (Signed)
NSGY recommendations for 24h watch off pressors appreciated. PCCM will be available PRN.  Myrla Halsted MD PCCM

## 2020-09-20 NOTE — Progress Notes (Signed)
Levo turned off at 0800.  Pt's neuro exam unchanged.  Pt determined to leave today @ 5pm despite physicians recommendations for her to stay until tomorrow.  Renae Fickle, NP with CCM relayed the message to pt that neurosurgery stated she should stay overnight. Pt is very upset with this and says she has done everything right and cooperated and is still leaving today.

## 2020-09-20 NOTE — Progress Notes (Signed)
Occupational Therapy Treatment Patient Details Name: Lori Pierce MRN: [id number] DOB: 05/03/1976 Today's Date: 09/20/2020    History of present illness Pt is a 45 y.o. F admitted 3/6 with WHOL. 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 Fall on 3/15 with accompanying dysarthria and hypotension. Significant PMH: asthma, allergies.   OT comments  Pt with improved cognition and awareness to L side. Pt meeting 2 out 3 goals. Recommendation for Outpatient follow up.   Follow Up Recommendations  Outpatient OT;Supervision - Intermittent    Equipment Recommendations  Tub/shower seat    Recommendations for Other Services      Precautions / Restrictions Precautions Precautions: Fall       Mobility Bed Mobility Overal bed mobility: Modified Independent                  Transfers Overall transfer level: Needs assistance   Transfers: Sit to/from Stand Sit to Stand: Supervision              Balance                                           ADL either performed or assessed with clinical judgement   ADL Overall ADL's : Needs assistance/impaired                         Toilet Transfer: Supervision/safety;BSC   Toileting- Clothing Manipulation and Hygiene: Supervision/safety       Functional mobility during ADLs: Supervision/safety       Vision       Perception     Praxis      Cognition Arousal/Alertness: Awake/alert Behavior During Therapy: WFL for tasks assessed/performed Overall Cognitive Status: Within Functional Limits for tasks assessed                                 General Comments: pt able to complete 5 step pathfinding task with 2step comamnds and final request 3 steps. pt able to locate multiple things on L side without cues.        Exercises     Shoulder Instructions        General Comments VSS    Pertinent Vitals/ Pain       Pain Assessment: No/denies pain  Home Living                                          Prior Functioning/Environment              Frequency  Min 2X/week        Progress Toward Goals  OT Goals(current goals can now be found in the care plan section)  Progress towards OT goals: Progressing toward goals  Acute Rehab OT Goals Patient Stated Goal: to shower by herself OT Goal Formulation: With patient Time For Goal Achievement: 09/21/20 Potential to Achieve Goals: Good ADL Goals Pt Will Perform Grooming: Independently;standing Additional ADL Goal #1: Pt will perform simple IADL task independently Additional ADL Goal #2: Pt will perform four part path finding task independently  Plan Discharge plan remains appropriate;Frequency remains appropriate  Co-evaluation                 AM-PAC OT "6 Clicks" Daily Activity     Outcome Measure   Help from another person eating meals?: A Little Help from another person taking care of personal grooming?: A Little Help from another person toileting, which includes using toliet, bedpan, or urinal?: A Little Help from another person bathing (including washing, rinsing, drying)?: A Little Help from another person to put on and taking off regular upper body clothing?: None Help from another person to put on and taking off regular lower body clothing?: None 6 Click Score: 20    End of Session    OT Visit Diagnosis: Unsteadiness on feet (R26.81);Other abnormalities of gait and mobility (R26.89);Muscle weakness (generalized) (M62.81);Pain   Activity Tolerance Patient tolerated treatment well   Patient Left in bed;with call bell/phone within reach;with bed alarm set   Nurse Communication Mobility status;Precautions        Time: 0277-4128 OT Time Calculation (min): 12 min  Charges: OT General Charges $OT Visit: 1 Visit OT Treatments $Self  Care/Home Management : 8-22 mins   Brynn, OTR/L  Acute Rehabilitation Services Pager: [phone] Office: [phone] .    Mateo Flow 09/20/2020, 12:25 PM

## 2020-09-20 NOTE — Discharge Summary (Signed)
°  Physician Discharge Summary  Patient ID: Lori Pierce MRN: [id number] DOB/AGE: 08/14/1975 45 y.o.  Admit date: 09/05/2020 Discharge date: 09/20/2020  Admission Diagnoses:  Ruptured cerebral aneurysm aSAH Cerebral vasospasm Cocaine abuse  Discharge Diagnoses:  Same Active Problems:   Ruptured cerebral aneurysm (HCC)   Malnutrition of moderate degree   Discharged Condition: Stable  Hospital Course:  Lori Pierce is a 45 y.o. female who presented to the [facility] ED 09/06/2019 with a severe headache who was fount to have SAH secondary to right PCOM aneurysm. She was transferred to [facility] ICU for further eval and management. She arrived to [facility] evening of 3/6. She was neurologically intact. She underwent diagnostic cerebral angiogram and coil embolization of right PCOM aneurysm on 3/7 by Dr Conchita Paris.  Her post operative course was complicated by symptomatic cerebral vasospasm (symptoms: left UE numbness, incoordination and slurred speech). She responded well to [facility] therapy. On 3/21, <24 hours off pressor supported HHH, patient left AMA despite multiple efforts by providers and nursing trying to convince patient to stay until the following morning (24 hours s/p HHH). Patient was sent home to complete 21 day course of nimotop.  Of note, patient had positive UDS for cocaine. Patient was counseled.  PATIENT LEFT AMA  Treatments: Surgery -  3/7: coil embolization of right posterior communicating artery aneurysm.  Discharge Exam: Blood pressure (!) 143/108, pulse 77, temperature 98.6 F (37 C), temperature source Oral, resp. rate 17, height 5\' 4"  (1.626 m), weight 54.5 kg, last menstrual period 09/03/2020, SpO2 100 %. Awake, alert, oriented Speech fluent, appropriate CN grossly intact 5/5 BUE/BLE Wound c/d/i  Disposition:      Allergies as of 09/20/2020   No Known Allergies      Medication List     TAKE these medications    acetaminophen 500 MG tablet Commonly known as:  TYLENOL Take 500 mg by mouth every 6 (six) hours as needed for moderate pain.   ibuprofen 200 MG tablet Commonly known as: ADVIL Take 200 mg by mouth every 6 (six) hours as needed for mild pain.   mirtazapine 7.5 MG tablet Commonly known as: REMERON Take 7.5 mg by mouth at bedtime.   niMODipine 30 MG capsule Commonly known as: NIMOTOP Take 2 capsules (60 mg total) by mouth every 4 (four) hours for 6 days.        Follow-up Information     09/22/2020, MD. Schedule an appointment as soon as possible for a visit in 3 week(s).   Specialty: Neurosurgery Contact information: [address] [place] [phone]                 Signed: [phone] 09/20/2020, 1:01 PM

## 2021-05-30 IMAGING — CT CT HEAD W/O CM
3 of 4 series · 13 of 47 positions shown, 15 images · non-contrast
Comparison: CT angiogram head 09/05/2020.

CLINICAL DATA: Subarachnoid hemorrhage (LACINLI), follow-up.

EXAM:
CT HEAD WITHOUT CONTRAST
TECHNIQUE: Contiguous axial images were obtained from the base of the skull
through the vertex without intravenous contrast.

[Series 3: head without · axial · non-contrast · 0.43mm/px · z∈[-73,+47]mm · 7 of 32 slices shown, 9 images]
[im 4/32  brain]
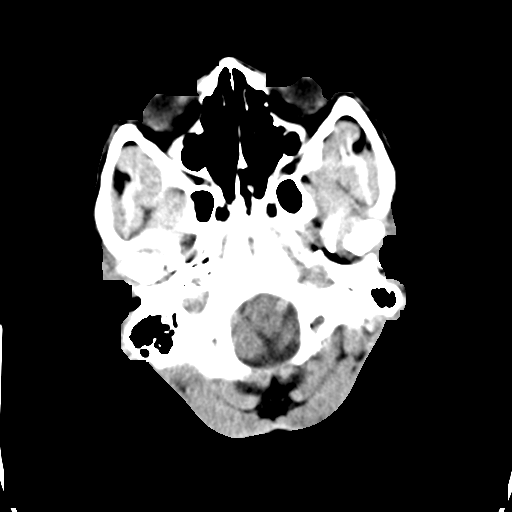
[im 4/32  bone]
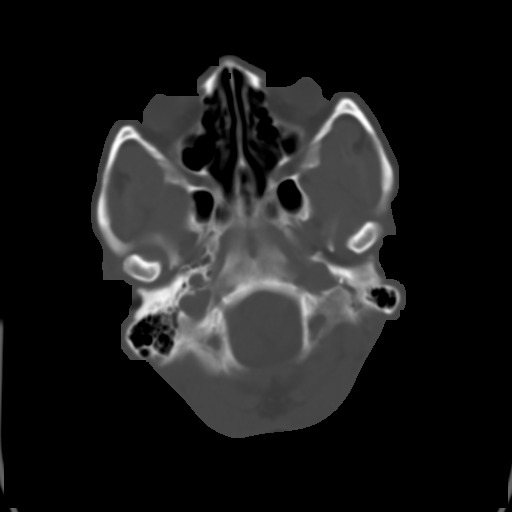
[im 8/32  brain]
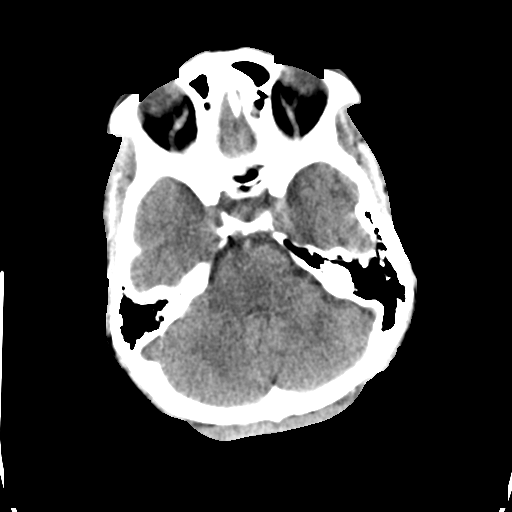
[im 12/32  brain]
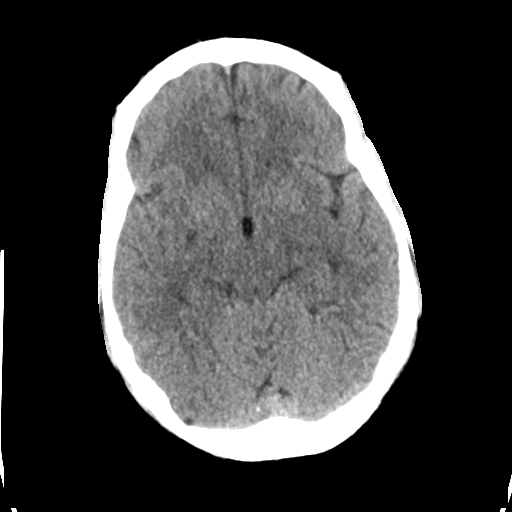
[im 16/32  brain]
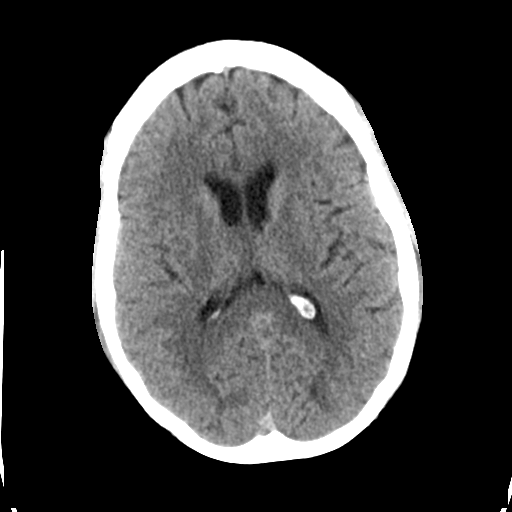
[im 20/32  brain]
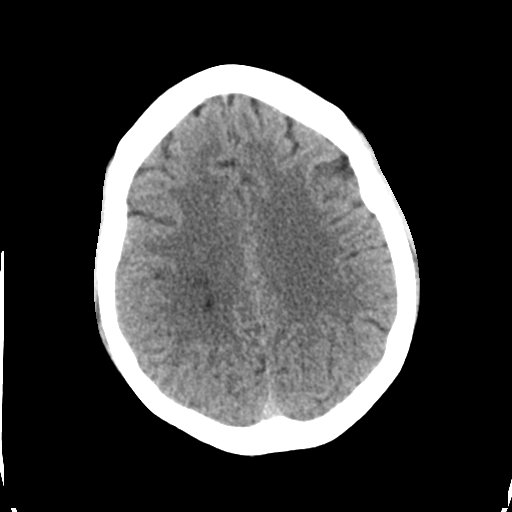
[im 20/32  bone]
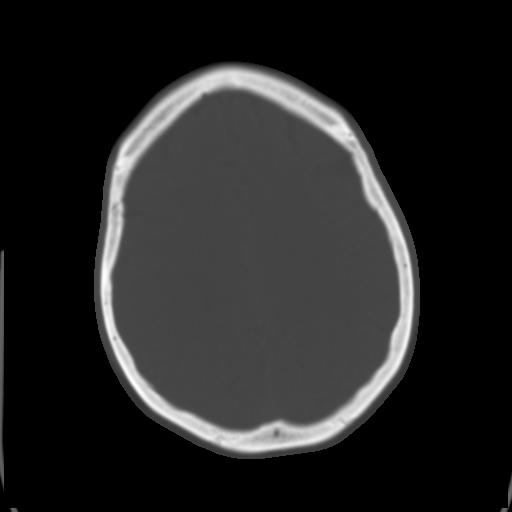
[im 24/32  brain]
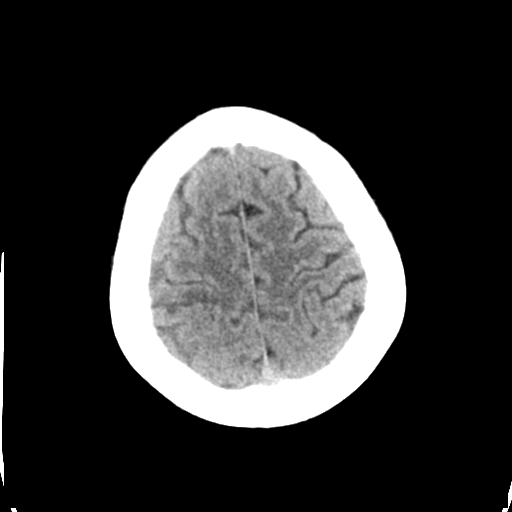
[im 28/32  brain]
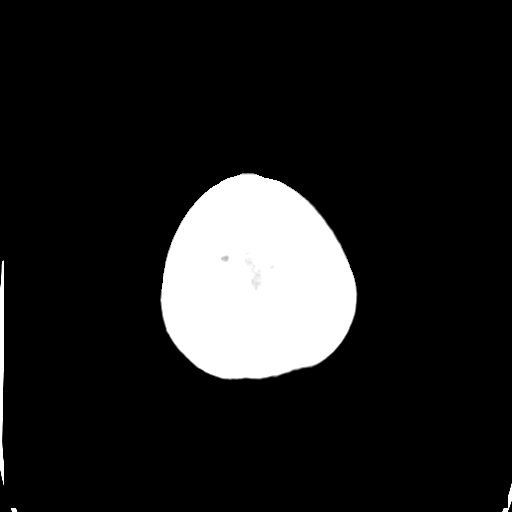

[Series 5: head without cor · coronal · non-contrast · 0.30mm/px · 3 of 67 slices shown]
[im 23/67  brain]
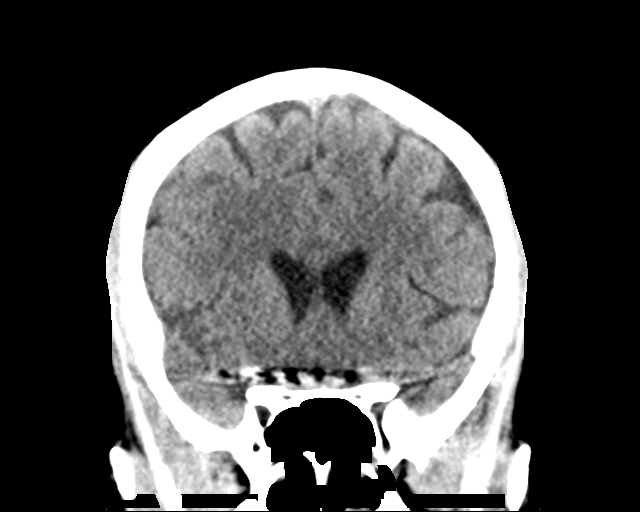
[im 30/67  brain]
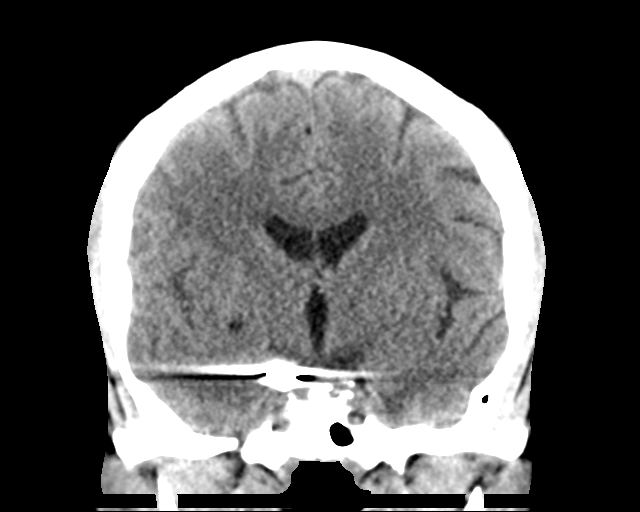
[im 37/67  brain]
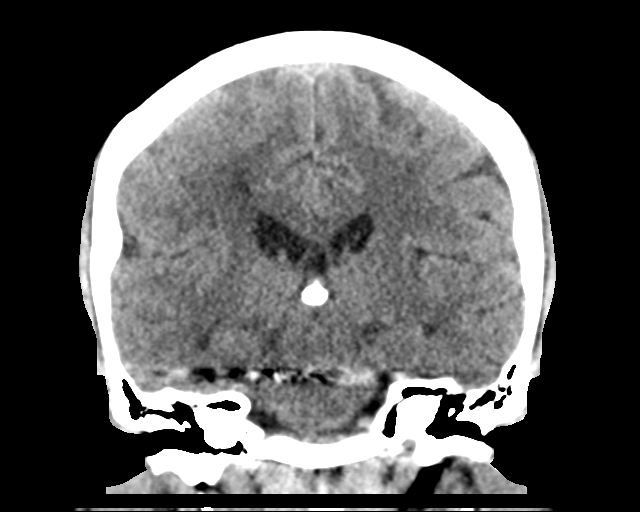

[Series 6: head without sag · sagittal · non-contrast · 0.30mm/px · 3 of 67 slices shown]
[im 23/67  brain]
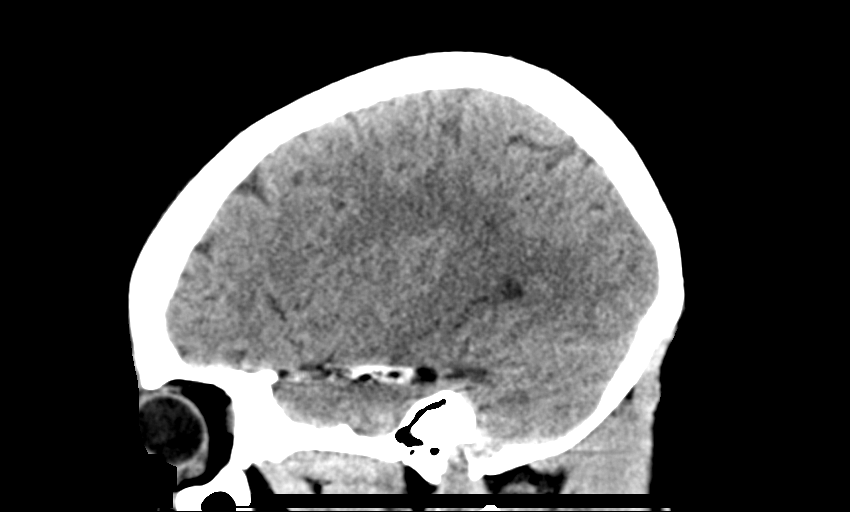
[im 34/67  brain]
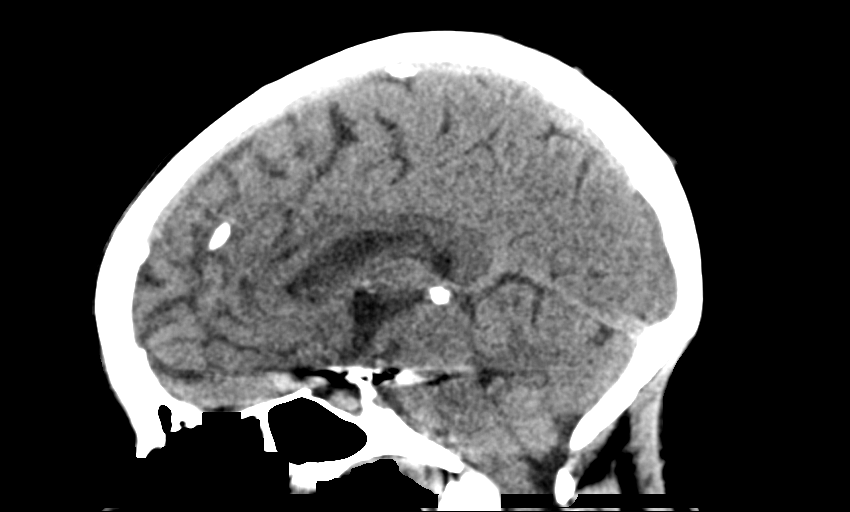
[im 45/67  brain]
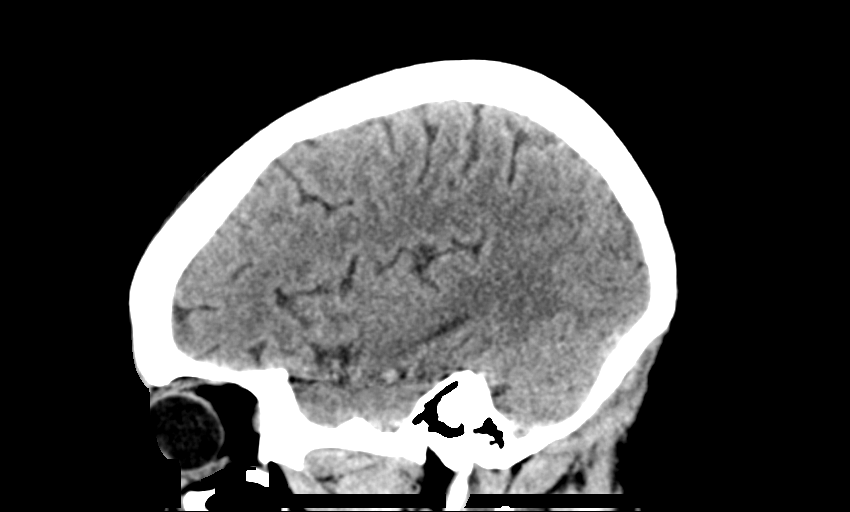

[13 of 47 positions shown; findings below may reference images not displayed]

Noncontrast head CT
09/05/2020. Report from coil embolization of a right posterior
communicating artery aneurysm 09/06/2020.
FINDINGS: Brain:

Streak artifact arising from coil embolization material in the
region of a treated right posterior communicating artery aneurysm.

This somewhat limits evaluation for residual subarachnoid hemorrhage
within the basal cisterns. Within this limitation, no
residual/recurrent subarachnoid hemorrhage is noted.

There are a few small infarcts within the right frontoparietal white
matter which are new as compared to the head CT of 09/05/2020 and
likely subacute. Additional small subacute appearing infarct within
the right frontoparietal cortex (series 3, images 23 and 24).

No extra-axial fluid collection.

No evidence of intracranial mass.

No midline shift.

Vascular: No appreciable hyperdense vessel.

Skull: Normal. Negative for fracture or focal lesion.

Sinuses/Orbits: Visualized orbits show no acute finding. No
significant paranasal sinus disease at the imaged levels.

These results will be called to the ordering clinician or
representative by the Radiologist Assistant, and communication
documented in the PACS or [REDACTED].
IMPRESSION: Interval coil embolization of a right posterior communicating artery
aneurysm.

Streak and beam hardening artifact arising from the coil material
somewhat limits evaluation for residual/recurrent subarachnoid
hemorrhage. Within this limitation, no residual/recurrent
subarachnoid hemorrhage is identified. No evidence of hydrocephalus.

There are small subacute appearing infarcts within the right
frontoparietal cortex and white matter, new as compared to the head
CT of 09/05/2020.
